# Patient Record
Sex: Female | Born: 1935 | Race: White | Hispanic: No | Smoking: Never smoker
Health system: Southern US, Community
[De-identification: ages and names within clinical notes are randomized; demographics above are authoritative.]

## PROBLEM LIST (undated history)

## (undated) DIAGNOSIS — K219 Gastro-esophageal reflux disease without esophagitis: Secondary | ICD-10-CM

## (undated) DIAGNOSIS — E559 Vitamin D deficiency, unspecified: Secondary | ICD-10-CM

## (undated) DIAGNOSIS — M810 Age-related osteoporosis without current pathological fracture: Secondary | ICD-10-CM

## (undated) DIAGNOSIS — F0390 Unspecified dementia without behavioral disturbance: Secondary | ICD-10-CM

---

## 2005-04-04 ENCOUNTER — Ambulatory Visit: Payer: Self-pay | Admitting: Internal Medicine

## 2005-09-24 ENCOUNTER — Ambulatory Visit: Payer: Self-pay | Admitting: Gastroenterology

## 2006-06-10 ENCOUNTER — Ambulatory Visit: Payer: Self-pay | Admitting: Internal Medicine

## 2007-04-01 ENCOUNTER — Ambulatory Visit: Payer: Self-pay | Admitting: Internal Medicine

## 2007-07-29 ENCOUNTER — Ambulatory Visit: Payer: Self-pay | Admitting: Internal Medicine

## 2008-10-12 ENCOUNTER — Ambulatory Visit: Payer: Self-pay | Admitting: Internal Medicine

## 2009-02-08 ENCOUNTER — Ambulatory Visit: Payer: Self-pay | Admitting: Orthopedic Surgery

## 2012-02-15 ENCOUNTER — Emergency Department: Payer: Self-pay | Admitting: Emergency Medicine

## 2012-06-11 ENCOUNTER — Ambulatory Visit: Payer: Self-pay | Admitting: Ophthalmology

## 2012-06-11 DIAGNOSIS — I499 Cardiac arrhythmia, unspecified: Secondary | ICD-10-CM

## 2012-06-17 ENCOUNTER — Ambulatory Visit: Payer: Self-pay | Admitting: Ophthalmology

## 2013-02-02 ENCOUNTER — Ambulatory Visit: Payer: Self-pay | Admitting: Ophthalmology

## 2013-03-09 ENCOUNTER — Ambulatory Visit: Payer: Self-pay | Admitting: Ophthalmology

## 2013-03-16 ENCOUNTER — Ambulatory Visit: Payer: Self-pay | Admitting: Ophthalmology

## 2014-12-20 NOTE — Op Note (Signed)
PATIENT NAME:  Megan Long, Megan Long MR#:  [id number] DATE OF BIRTH:  [birth year]  DATE OF PROCEDURE:  06/17/2012  PROCEDURE PERFORMED:  1. Pars plana vitrectomy of the right eye.  2. Focal laser of the right eye.   PREOPERATIVE DIAGNOSES:  1. Subretinal hemorrhage.  2. Macular aneurysm.   POSTOPERATIVE DIAGNOSES:  1. Subretinal hemorrhage.  2. Macular aneurysm.   ESTIMATED BLOOD LOSS: Less than 1 mL.   PRIMARY SURGEON: Aron BabaMatthew Tierany Appleby, M.D.   ANESTHESIA: Retrobulbar block of the right eye with monitored anesthesia care.   COMPLICATIONS: None.   INDICATIONS FOR PROCEDURE:  This is a patient who presented to my office with loss of vision approximately one month ago. Examination revealed significant subretinal macular hemorrhage with an associated macular aneurysm. The risks, benefits, and alternatives of the above procedure were discussed and the patient wished to proceed.   DETAILS OF PROCEDURE: After informed consent was obtained, the patient was brought to the operating suite at [facility]. The patient was placed in the supine position, was given a small dose of Alfenta, and a retrobulbar block was performed on the right eye by the primary surgeon without any complications. The right eye was prepped and draped in sterile manner. After lid speculum was inserted, a 25-gauge trocar was placed inferotemporally through displaced conjunctiva in an oblique fashion 4 mm beyond the limbus. The infusion cannula was turned on and inserted through the trocar and secured in position with Steri-Strips. Two more trocars were placed in a similar fashion superotemporally and superonasally. The vitreous cutter and light pipe were introduced into the eye and a core vitrectomy was performed. Peripheral vitreous was trimmed in a limited fashion. Focal laser was then introduced and laser was performed on the macular aneurysm to get it to close. The macular aneurysm opened and began with  significant arterial style bleeding. Pressure in the eye was elevated and allowed to sit for approximately one minute. Blood was removed using the vitreous cutter and to reveal the site of the bleed. An Endocutter was introduced and the macular aneurysm was cauterized. Bleeding quickly stopped and there was no extension of the subretinal blood. Given the recent bleed, the attempt of TPA was aborted. There was subretinal spacing already present and the decision was made to convert to a pneumatic displacement. Two to three rows of laser were placed around the area of cauterization in order to cauterize the retinal opening. An air-fluid exchange was then performed and 18% SS6 was used as an Systems developerair-gas exchange. The trocars were removed and the wounds were noted to be airtight. Pressure in the eye was confirmed to be approximately 15 mmHg. The lid speculum was removed and the eye was cleaned. 5 mg of dexamethasone were given into the inferior fornix and TobraDex was placed in the eye. A patch and shield were placed over the eye. The patient was taken to postanesthesia care with instructions to remain face down.   ____________________________ Ignacia FellingMatthew F. Champ MungoAppenzeller, MD mfa:bjt D: 06/17/2012 11:11:00 ET T: 06/17/2012 11:24:11 ET JOB#: [id number]  cc: Ignacia FellingMatthew F. Champ MungoAppenzeller, MD, <Dictator> Cline CoolsMATTHEW F Jaylene Arrowood MD ELECTRONICALLY SIGNED 06/30/2012 17:44

## 2014-12-23 NOTE — Op Note (Signed)
PATIENT NAME:  [name], Megan Long DATE OF BIRTH:  May 12, 1922  DATE OF PROCEDURE:  03/16/2013  PREOPERATIVE DIAGNOSIS: Visually significant cataract of the left eye.   POSTOPERATIVE DIAGNOSIS: Visually significant cataract of the left eye.   OPERATIVE PROCEDURE: Cataract extraction by phacoemulsification with implant of intraocular lens to left eye.   SURGEON: Galen ManilaWilliam Evana Runnels, MD.   ANESTHESIA:  1. Managed anesthesia care.  2. Topical tetracaine drops followed by 2% Xylocaine jelly applied in the preoperative holding area.   COMPLICATIONS: None.   TECHNIQUE: Stop and chop.   DESCRIPTION OF PROCEDURE: The patient was examined and consented in the preoperative holding area where the aforementioned topical anesthesia was applied to the left eye and then brought back to the Operating Room where the left eye was prepped and draped in the usual sterile ophthalmic fashion and a lid speculum was placed. A paracentesis was created with the side port blade and the anterior chamber was filled with viscoelastic. A near clear corneal incision was performed with the steel keratome. A continuous curvilinear capsulorrhexis was performed with a cystotome followed by the capsulorrhexis forceps. Hydrodissection and hydrodelineation were carried out with BSS on a blunt cannula. The lens was removed in a stop and chop technique and the remaining cortical material was removed with the irrigation-aspiration handpiece. The capsular bag was inflated with viscoelastic and the Tecnis ZCB00 22.5-diopter lens, serial #[id number], was placed in the capsular bag without complication. The remaining viscoelastic was removed from the eye with the irrigation-aspiration handpiece. The wounds were hydrated. The anterior chamber was flushed with Miostat and the eye was inflated to physiologic pressure. 0.1 mL of cefuroxime concentration 10 mg/mL was placed in the anterior chamber. The wounds were found to be water tight.  The eye was dressed with Vigamox. The patient was given protective glasses to wear throughout the day and a shield with which to sleep tonight. The patient was also given drops with which to begin a drop regimen today and will follow-up with me in one day.    ____________________________ Jerilee FieldWilliam L. Yilia Sacca, MD wlp:jm D: 03/16/2013 16:52:08 ET T: 03/16/2013 20:24:26 ET JOB#: [id number]  cc: Colvin Blatt L. Geneva Pallas, MD, <Dictator> Jerilee FieldWILLIAM L Chrstopher Malenfant MD ELECTRONICALLY SIGNED 03/19/2013 8:53

## 2017-04-17 DIAGNOSIS — Z23 Encounter for immunization: Secondary | ICD-10-CM | POA: Diagnosis not present

## 2017-04-17 DIAGNOSIS — Z66 Do not resuscitate: Secondary | ICD-10-CM | POA: Insufficient documentation

## 2017-04-17 DIAGNOSIS — Z0000 Encounter for general adult medical examination without abnormal findings: Secondary | ICD-10-CM | POA: Diagnosis not present

## 2018-03-30 DIAGNOSIS — R2689 Other abnormalities of gait and mobility: Secondary | ICD-10-CM | POA: Diagnosis not present

## 2018-03-30 DIAGNOSIS — R278 Other lack of coordination: Secondary | ICD-10-CM | POA: Diagnosis not present

## 2018-03-31 DIAGNOSIS — R278 Other lack of coordination: Secondary | ICD-10-CM | POA: Diagnosis not present

## 2018-03-31 DIAGNOSIS — R2689 Other abnormalities of gait and mobility: Secondary | ICD-10-CM | POA: Diagnosis not present

## 2018-04-01 DIAGNOSIS — R278 Other lack of coordination: Secondary | ICD-10-CM | POA: Diagnosis not present

## 2018-04-01 DIAGNOSIS — R2689 Other abnormalities of gait and mobility: Secondary | ICD-10-CM | POA: Diagnosis not present

## 2018-04-03 DIAGNOSIS — R2689 Other abnormalities of gait and mobility: Secondary | ICD-10-CM | POA: Diagnosis not present

## 2018-04-03 DIAGNOSIS — R278 Other lack of coordination: Secondary | ICD-10-CM | POA: Diagnosis not present

## 2018-04-03 DIAGNOSIS — R296 Repeated falls: Secondary | ICD-10-CM | POA: Diagnosis not present

## 2018-04-06 DIAGNOSIS — R296 Repeated falls: Secondary | ICD-10-CM | POA: Diagnosis not present

## 2018-04-06 DIAGNOSIS — R278 Other lack of coordination: Secondary | ICD-10-CM | POA: Diagnosis not present

## 2018-04-06 DIAGNOSIS — R2689 Other abnormalities of gait and mobility: Secondary | ICD-10-CM | POA: Diagnosis not present

## 2018-04-07 DIAGNOSIS — R2689 Other abnormalities of gait and mobility: Secondary | ICD-10-CM | POA: Diagnosis not present

## 2018-04-07 DIAGNOSIS — R278 Other lack of coordination: Secondary | ICD-10-CM | POA: Diagnosis not present

## 2018-04-07 DIAGNOSIS — R296 Repeated falls: Secondary | ICD-10-CM | POA: Diagnosis not present

## 2018-04-10 DIAGNOSIS — R278 Other lack of coordination: Secondary | ICD-10-CM | POA: Diagnosis not present

## 2018-04-10 DIAGNOSIS — R296 Repeated falls: Secondary | ICD-10-CM | POA: Diagnosis not present

## 2018-04-10 DIAGNOSIS — R2689 Other abnormalities of gait and mobility: Secondary | ICD-10-CM | POA: Diagnosis not present

## 2018-04-13 DIAGNOSIS — R2689 Other abnormalities of gait and mobility: Secondary | ICD-10-CM | POA: Diagnosis not present

## 2018-04-13 DIAGNOSIS — R296 Repeated falls: Secondary | ICD-10-CM | POA: Diagnosis not present

## 2018-04-13 DIAGNOSIS — R278 Other lack of coordination: Secondary | ICD-10-CM | POA: Diagnosis not present

## 2018-04-14 DIAGNOSIS — R2689 Other abnormalities of gait and mobility: Secondary | ICD-10-CM | POA: Diagnosis not present

## 2018-04-14 DIAGNOSIS — R278 Other lack of coordination: Secondary | ICD-10-CM | POA: Diagnosis not present

## 2018-04-14 DIAGNOSIS — R296 Repeated falls: Secondary | ICD-10-CM | POA: Diagnosis not present

## 2018-04-17 DIAGNOSIS — R278 Other lack of coordination: Secondary | ICD-10-CM | POA: Diagnosis not present

## 2018-04-17 DIAGNOSIS — R2689 Other abnormalities of gait and mobility: Secondary | ICD-10-CM | POA: Diagnosis not present

## 2018-04-17 DIAGNOSIS — R296 Repeated falls: Secondary | ICD-10-CM | POA: Diagnosis not present

## 2018-04-21 DIAGNOSIS — R2689 Other abnormalities of gait and mobility: Secondary | ICD-10-CM | POA: Diagnosis not present

## 2018-04-21 DIAGNOSIS — R296 Repeated falls: Secondary | ICD-10-CM | POA: Diagnosis not present

## 2018-04-21 DIAGNOSIS — R278 Other lack of coordination: Secondary | ICD-10-CM | POA: Diagnosis not present

## 2018-04-22 DIAGNOSIS — R296 Repeated falls: Secondary | ICD-10-CM | POA: Diagnosis not present

## 2018-04-22 DIAGNOSIS — R278 Other lack of coordination: Secondary | ICD-10-CM | POA: Diagnosis not present

## 2018-04-22 DIAGNOSIS — R2689 Other abnormalities of gait and mobility: Secondary | ICD-10-CM | POA: Diagnosis not present

## 2018-04-24 DIAGNOSIS — R278 Other lack of coordination: Secondary | ICD-10-CM | POA: Diagnosis not present

## 2018-04-24 DIAGNOSIS — R2689 Other abnormalities of gait and mobility: Secondary | ICD-10-CM | POA: Diagnosis not present

## 2018-04-24 DIAGNOSIS — R296 Repeated falls: Secondary | ICD-10-CM | POA: Diagnosis not present

## 2018-04-28 DIAGNOSIS — R278 Other lack of coordination: Secondary | ICD-10-CM | POA: Diagnosis not present

## 2018-04-28 DIAGNOSIS — R2689 Other abnormalities of gait and mobility: Secondary | ICD-10-CM | POA: Diagnosis not present

## 2018-04-28 DIAGNOSIS — R296 Repeated falls: Secondary | ICD-10-CM | POA: Diagnosis not present

## 2018-04-29 DIAGNOSIS — R2689 Other abnormalities of gait and mobility: Secondary | ICD-10-CM | POA: Diagnosis not present

## 2018-04-29 DIAGNOSIS — R296 Repeated falls: Secondary | ICD-10-CM | POA: Diagnosis not present

## 2018-04-29 DIAGNOSIS — R278 Other lack of coordination: Secondary | ICD-10-CM | POA: Diagnosis not present

## 2018-04-30 DIAGNOSIS — R296 Repeated falls: Secondary | ICD-10-CM | POA: Diagnosis not present

## 2018-04-30 DIAGNOSIS — R2689 Other abnormalities of gait and mobility: Secondary | ICD-10-CM | POA: Diagnosis not present

## 2018-04-30 DIAGNOSIS — R278 Other lack of coordination: Secondary | ICD-10-CM | POA: Diagnosis not present

## 2018-05-06 DIAGNOSIS — R278 Other lack of coordination: Secondary | ICD-10-CM | POA: Diagnosis not present

## 2018-05-06 DIAGNOSIS — R2689 Other abnormalities of gait and mobility: Secondary | ICD-10-CM | POA: Diagnosis not present

## 2018-05-07 DIAGNOSIS — R278 Other lack of coordination: Secondary | ICD-10-CM | POA: Diagnosis not present

## 2018-05-07 DIAGNOSIS — R2689 Other abnormalities of gait and mobility: Secondary | ICD-10-CM | POA: Diagnosis not present

## 2018-05-08 DIAGNOSIS — R2689 Other abnormalities of gait and mobility: Secondary | ICD-10-CM | POA: Diagnosis not present

## 2018-05-08 DIAGNOSIS — R278 Other lack of coordination: Secondary | ICD-10-CM | POA: Diagnosis not present

## 2018-05-11 DIAGNOSIS — R278 Other lack of coordination: Secondary | ICD-10-CM | POA: Diagnosis not present

## 2018-05-11 DIAGNOSIS — R2689 Other abnormalities of gait and mobility: Secondary | ICD-10-CM | POA: Diagnosis not present

## 2018-05-13 DIAGNOSIS — R2689 Other abnormalities of gait and mobility: Secondary | ICD-10-CM | POA: Diagnosis not present

## 2018-05-13 DIAGNOSIS — R278 Other lack of coordination: Secondary | ICD-10-CM | POA: Diagnosis not present

## 2018-05-18 DIAGNOSIS — R278 Other lack of coordination: Secondary | ICD-10-CM | POA: Diagnosis not present

## 2018-05-18 DIAGNOSIS — R2689 Other abnormalities of gait and mobility: Secondary | ICD-10-CM | POA: Diagnosis not present

## 2018-05-20 DIAGNOSIS — R278 Other lack of coordination: Secondary | ICD-10-CM | POA: Diagnosis not present

## 2018-05-20 DIAGNOSIS — R2689 Other abnormalities of gait and mobility: Secondary | ICD-10-CM | POA: Diagnosis not present

## 2018-05-26 DIAGNOSIS — R278 Other lack of coordination: Secondary | ICD-10-CM | POA: Diagnosis not present

## 2018-05-26 DIAGNOSIS — R2689 Other abnormalities of gait and mobility: Secondary | ICD-10-CM | POA: Diagnosis not present

## 2018-06-02 DIAGNOSIS — R278 Other lack of coordination: Secondary | ICD-10-CM | POA: Diagnosis not present

## 2018-06-02 DIAGNOSIS — R2689 Other abnormalities of gait and mobility: Secondary | ICD-10-CM | POA: Diagnosis not present

## 2018-12-03 DIAGNOSIS — R2681 Unsteadiness on feet: Secondary | ICD-10-CM | POA: Diagnosis not present

## 2018-12-03 DIAGNOSIS — M85849 Other specified disorders of bone density and structure, unspecified hand: Secondary | ICD-10-CM | POA: Diagnosis not present

## 2018-12-03 DIAGNOSIS — F0390 Unspecified dementia without behavioral disturbance: Secondary | ICD-10-CM | POA: Diagnosis not present

## 2018-12-08 DIAGNOSIS — Z79899 Other long term (current) drug therapy: Secondary | ICD-10-CM | POA: Diagnosis not present

## 2018-12-08 DIAGNOSIS — M461 Sacroiliitis, not elsewhere classified: Secondary | ICD-10-CM | POA: Diagnosis not present

## 2018-12-08 DIAGNOSIS — Z01419 Encounter for gynecological examination (general) (routine) without abnormal findings: Secondary | ICD-10-CM | POA: Diagnosis not present

## 2018-12-08 DIAGNOSIS — E559 Vitamin D deficiency, unspecified: Secondary | ICD-10-CM | POA: Diagnosis not present

## 2018-12-08 DIAGNOSIS — G894 Chronic pain syndrome: Secondary | ICD-10-CM | POA: Diagnosis not present

## 2018-12-08 DIAGNOSIS — Z1239 Encounter for other screening for malignant neoplasm of breast: Secondary | ICD-10-CM | POA: Diagnosis not present

## 2018-12-08 DIAGNOSIS — I4819 Other persistent atrial fibrillation: Secondary | ICD-10-CM | POA: Diagnosis not present

## 2018-12-08 DIAGNOSIS — I10 Essential (primary) hypertension: Secondary | ICD-10-CM | POA: Diagnosis not present

## 2018-12-08 DIAGNOSIS — M533 Sacrococcygeal disorders, not elsewhere classified: Secondary | ICD-10-CM | POA: Diagnosis not present

## 2018-12-08 DIAGNOSIS — R079 Chest pain, unspecified: Secondary | ICD-10-CM | POA: Diagnosis not present

## 2018-12-08 DIAGNOSIS — M79671 Pain in right foot: Secondary | ICD-10-CM | POA: Diagnosis not present

## 2018-12-08 DIAGNOSIS — N904 Leukoplakia of vulva: Secondary | ICD-10-CM | POA: Diagnosis not present

## 2018-12-08 DIAGNOSIS — M25561 Pain in right knee: Secondary | ICD-10-CM | POA: Diagnosis not present

## 2018-12-08 DIAGNOSIS — E1169 Type 2 diabetes mellitus with other specified complication: Secondary | ICD-10-CM | POA: Diagnosis not present

## 2018-12-08 DIAGNOSIS — N400 Enlarged prostate without lower urinary tract symptoms: Secondary | ICD-10-CM | POA: Diagnosis not present

## 2018-12-08 DIAGNOSIS — E782 Mixed hyperlipidemia: Secondary | ICD-10-CM | POA: Diagnosis not present

## 2018-12-08 DIAGNOSIS — M25674 Stiffness of right foot, not elsewhere classified: Secondary | ICD-10-CM | POA: Diagnosis not present

## 2018-12-17 DIAGNOSIS — N390 Urinary tract infection, site not specified: Secondary | ICD-10-CM | POA: Diagnosis not present

## 2018-12-17 DIAGNOSIS — R399 Unspecified symptoms and signs involving the genitourinary system: Secondary | ICD-10-CM | POA: Diagnosis not present

## 2018-12-17 DIAGNOSIS — E559 Vitamin D deficiency, unspecified: Secondary | ICD-10-CM | POA: Diagnosis not present

## 2018-12-17 DIAGNOSIS — R609 Edema, unspecified: Secondary | ICD-10-CM | POA: Diagnosis not present

## 2018-12-17 DIAGNOSIS — R7309 Other abnormal glucose: Secondary | ICD-10-CM | POA: Diagnosis not present

## 2018-12-17 DIAGNOSIS — F0390 Unspecified dementia without behavioral disturbance: Secondary | ICD-10-CM | POA: Diagnosis not present

## 2018-12-17 DIAGNOSIS — Z79899 Other long term (current) drug therapy: Secondary | ICD-10-CM | POA: Diagnosis not present

## 2018-12-23 DIAGNOSIS — R399 Unspecified symptoms and signs involving the genitourinary system: Secondary | ICD-10-CM | POA: Diagnosis not present

## 2019-01-21 ENCOUNTER — Emergency Department: Payer: PPO

## 2019-01-21 ENCOUNTER — Encounter: Payer: Self-pay | Admitting: Emergency Medicine

## 2019-01-21 ENCOUNTER — Other Ambulatory Visit: Payer: Self-pay

## 2019-01-21 ENCOUNTER — Inpatient Hospital Stay
Admission: EM | Admit: 2019-01-21 | Discharge: 2019-01-22 | DRG: 379 | Disposition: A | Discharge status: Home / Self Care | Payer: PPO | Attending: Internal Medicine | Admitting: Internal Medicine

## 2019-01-21 DIAGNOSIS — Z79899 Other long term (current) drug therapy: Secondary | ICD-10-CM

## 2019-01-21 DIAGNOSIS — R569 Unspecified convulsions: Secondary | ICD-10-CM | POA: Diagnosis present

## 2019-01-21 DIAGNOSIS — G479 Sleep disorder, unspecified: Secondary | ICD-10-CM | POA: Diagnosis not present

## 2019-01-21 DIAGNOSIS — Z1159 Encounter for screening for other viral diseases: Secondary | ICD-10-CM | POA: Diagnosis not present

## 2019-01-21 DIAGNOSIS — K922 Gastrointestinal hemorrhage, unspecified: Secondary | ICD-10-CM | POA: Diagnosis present

## 2019-01-21 DIAGNOSIS — K920 Hematemesis: Secondary | ICD-10-CM | POA: Diagnosis not present

## 2019-01-21 DIAGNOSIS — F0390 Unspecified dementia without behavioral disturbance: Secondary | ICD-10-CM | POA: Diagnosis present

## 2019-01-21 DIAGNOSIS — R112 Nausea with vomiting, unspecified: Secondary | ICD-10-CM | POA: Diagnosis not present

## 2019-01-21 DIAGNOSIS — K2100 Gastro-esophageal reflux disease with esophagitis, without bleeding: Secondary | ICD-10-CM

## 2019-01-21 DIAGNOSIS — R030 Elevated blood-pressure reading, without diagnosis of hypertension: Secondary | ICD-10-CM | POA: Diagnosis not present

## 2019-01-21 DIAGNOSIS — Z66 Do not resuscitate: Secondary | ICD-10-CM | POA: Diagnosis not present

## 2019-01-21 DIAGNOSIS — Z03818 Encounter for observation for suspected exposure to other biological agents ruled out: Secondary | ICD-10-CM | POA: Diagnosis not present

## 2019-01-21 DIAGNOSIS — K209 Esophagitis, unspecified: Secondary | ICD-10-CM | POA: Diagnosis not present

## 2019-01-21 DIAGNOSIS — E876 Hypokalemia: Secondary | ICD-10-CM | POA: Diagnosis not present

## 2019-01-21 DIAGNOSIS — K219 Gastro-esophageal reflux disease without esophagitis: Secondary | ICD-10-CM | POA: Diagnosis not present

## 2019-01-21 DIAGNOSIS — K449 Diaphragmatic hernia without obstruction or gangrene: Secondary | ICD-10-CM | POA: Diagnosis present

## 2019-01-21 DIAGNOSIS — N3000 Acute cystitis without hematuria: Secondary | ICD-10-CM | POA: Diagnosis not present

## 2019-01-21 DIAGNOSIS — R195 Other fecal abnormalities: Secondary | ICD-10-CM | POA: Diagnosis not present

## 2019-01-21 DIAGNOSIS — K921 Melena: Secondary | ICD-10-CM | POA: Diagnosis not present

## 2019-01-21 DIAGNOSIS — R1111 Vomiting without nausea: Secondary | ICD-10-CM | POA: Diagnosis not present

## 2019-01-21 DIAGNOSIS — E559 Vitamin D deficiency, unspecified: Secondary | ICD-10-CM | POA: Diagnosis not present

## 2019-01-21 HISTORY — DX: Unspecified dementia, unspecified severity, without behavioral disturbance, psychotic disturbance, mood disturbance, and anxiety: F03.90

## 2019-01-21 LAB — COMPREHENSIVE METABOLIC PANEL
ALT: 12 U/L (ref 0–44)
AST: 24 U/L (ref 15–41)
Albumin: 3.8 g/dL (ref 3.5–5.0)
Alkaline Phosphatase: 89 U/L (ref 38–126)
Anion gap: 11 (ref 5–15)
BUN: 53 mg/dL — ABNORMAL HIGH (ref 8–23)
CO2: 29 mmol/L (ref 22–32)
Calcium: 9.1 mg/dL (ref 8.9–10.3)
Chloride: 100 mmol/L (ref 98–111)
Creatinine, Ser: 1.24 mg/dL — ABNORMAL HIGH (ref 0.44–1.00)
GFR calc Af Amer: 43 mL/min — ABNORMAL LOW (ref >60)
GFR calc non Af Amer: 37 mL/min — ABNORMAL LOW (ref >60)
Glucose, Bld: 110 mg/dL — ABNORMAL HIGH (ref 70–99)
Potassium: 3.4 mmol/L — ABNORMAL LOW (ref 3.5–5.1)
Sodium: 140 mmol/L (ref 135–145)
Total Bilirubin: 1.6 mg/dL — ABNORMAL HIGH (ref 0.3–1.2)
Total Protein: 6.9 g/dL (ref 6.5–8.1)

## 2019-01-21 LAB — HEMOGLOBIN AND HEMATOCRIT, BLOOD
HCT: 42.1 % (ref 36.0–46.0)
Hemoglobin: 13.7 g/dL (ref 12.0–15.0)

## 2019-01-21 LAB — CBC
HCT: 45.9 % (ref 36.0–46.0)
Hemoglobin: 14.8 g/dL (ref 12.0–15.0)
MCH: 28.7 pg (ref 26.0–34.0)
MCHC: 32.2 g/dL (ref 30.0–36.0)
MCV: 89.1 fL (ref 80.0–100.0)
Platelets: 198 10*3/uL (ref 150–400)
RBC: 5.15 MIL/uL — ABNORMAL HIGH (ref 3.87–5.11)
RDW: 14.9 % (ref 11.5–15.5)
WBC: 11.8 10*3/uL — ABNORMAL HIGH (ref 4.0–10.5)
nRBC: 0 % (ref 0.0–0.2)

## 2019-01-21 LAB — PROTIME-INR
INR: 1.1 (ref 0.8–1.2)
Prothrombin Time: 13.6 seconds (ref 11.4–15.2)

## 2019-01-21 LAB — SARS CORONAVIRUS 2 BY RT PCR (HOSPITAL ORDER, PERFORMED IN ~~LOC~~ HOSPITAL LAB): SARS Coronavirus 2: NEGATIVE

## 2019-01-21 LAB — TYPE AND SCREEN
ABO/RH(D): O NEG
Antibody Screen: NEGATIVE

## 2019-01-21 LAB — APTT: aPTT: 25 seconds (ref 24–36)

## 2019-01-21 MED ORDER — SODIUM CHLORIDE 0.9 % IV BOLUS
250.0000 mL | Freq: Once | INTRAVENOUS | Status: AC
  Administered 2019-01-21: 250 mL via INTRAVENOUS

## 2019-01-21 MED ORDER — PANTOPRAZOLE SODIUM 40 MG IV SOLR
40.0000 mg | Freq: Two times a day (BID) | INTRAVENOUS | Status: DC
  Administered 2019-01-22 (×2): 40 mg via INTRAVENOUS
  Filled 2019-01-21 (×2): qty 40

## 2019-01-21 MED ORDER — SODIUM CHLORIDE 0.9 % IV SOLN
80.0000 mg | Freq: Once | INTRAVENOUS | Status: AC
  Administered 2019-01-21: 80 mg via INTRAVENOUS
  Filled 2019-01-21: qty 80

## 2019-01-21 MED ORDER — POTASSIUM CHLORIDE 10 MEQ/100ML IV SOLN
10.0000 meq | INTRAVENOUS | Status: AC
  Administered 2019-01-21: 10 meq via INTRAVENOUS
  Filled 2019-01-21: qty 100

## 2019-01-21 MED ORDER — PANTOPRAZOLE SODIUM 40 MG IV SOLR
40.0000 mg | Freq: Once | INTRAVENOUS | Status: AC
  Administered 2019-01-21: 40 mg via INTRAVENOUS
  Filled 2019-01-21: qty 40

## 2019-01-21 MED ORDER — VITAMIN D 25 MCG (1000 UNIT) PO TABS
1000.0000 [IU] | ORAL_TABLET | ORAL | Status: DC
  Administered 2019-01-22: 1000 [IU] via ORAL
  Filled 2019-01-21: qty 1

## 2019-01-21 NOTE — H&P (Signed)
[facility] [place] [facility]   PATIENT NAME: Megan Long    MR#:  [id number]  DATE OF BIRTH:  1922-04-07  DATE OF ADMISSION:  01/21/2019  PRIMARY CARE PHYSICIAN: Marisue IvanLinthavong, Kanhka, MD   REQUESTING/REFERRING PHYSICIAN: Willy Eddyobinson, Patrick  CHIEF COMPLAINT:   Chief Complaint  Patient presents with  . GI Problem  . Hematemesis    HISTORY OF PRESENT ILLNESS:  Megan Long  is a 85 y.o. female with a known history of gastroesophageal reflux disease and dementia who was brought in from nursing home with complaints of coffee-ground emesis and having dark stools that started last night.  Patient was evaluated by doctors making house call and reported to have evidence of coffee-ground emesis.  Patient not on any blood thinners.  No prior history of GI bleed.  No abdominal pain.  Daughter at bedside also confirmed patient had some dark stools.  No further vomiting or dark stools today.  Patient was evaluated in the emergency room and found to be hemodynamically stable.  Hemoglobin stable at 14.8.  Abdominal /chest x-ray done with no evidence of bowel obstruction or ileus.  Large hiatal hernia noted.  No acute cardiopulmonary disease.  Medical service called to admit patient for further evaluation and management.  PAST MEDICAL HISTORY:   Past Medical History:  Diagnosis Date  . Dementia (HCC)     PAST SURGICAL HISTORY:  History reviewed. No pertinent surgical history.  SOCIAL HISTORY:   Social History   Tobacco Use  . Smoking status: Not on file  Substance Use Topics  . Alcohol use: Not on file   Social history; No history of smoking alcohol or IV drug use.  FAMILY HISTORY:  Patient is a resident of nursing facility.  No family history of coronary artery disease or diabetes mellitus.  DRUG ALLERGIES:  No Known Allergies  REVIEW OF SYSTEMS:   ROS unobtainable due to underlying dementia  MEDICATIONS AT HOME:   Prior to Admission medications    Medication Sig Start Date End Date Taking? Authorizing Provider  cholecalciferol (VITAMIN D3) 25 MCG (1000 UT) tablet Take 1,000 Units by mouth 3 (three) times a week.   Yes [provider]      VITAL SIGNS:  Blood pressure 137/61, pulse 77, temperature (!) 97.5 F (36.4 C), temperature source Oral, resp. rate 17, height 4\' 11"  (1.499 m), weight 54.4 kg, SpO2 94 %.  PHYSICAL EXAMINATION:  Physical Exam  GENERAL:  85 y.o.-year-old patient lying in the bed with no acute distress.  EYES: Pupils equal, round, reactive to light and accommodation. No scleral icterus. Extraocular muscles intact.  HEENT: Head atraumatic, normocephalic. Oropharynx and nasopharynx clear.  NECK:  Supple, no jugular venous distention. No thyroid enlargement, no tenderness.  LUNGS: Normal breath sounds bilaterally, no wheezing, rales,rhonchi or crepitation. No use of accessory muscles of respiration.  CARDIOVASCULAR: S1, S2 normal. No murmurs, rubs, or gallops.  ABDOMEN: Soft, nontender, nondistended. Bowel sounds present. No organomegaly or mass.  EXTREMITIES: No pedal edema, cyanosis, or clubbing.  NEUROLOGIC: Cranial nerves II through XII are intact.  Generalized weakness. . Sensation intact. Gait not checked.  PSYCHIATRIC: The patient is alert and oriented x 3.  SKIN: No obvious rash, lesion, or ulcer.   LABORATORY PANEL:   CBC Recent Labs  Lab 01/21/19 1450  WBC 11.8*  HGB 14.8  HCT 45.9  PLT 198   ------------------------------------------------------------------------------------------------------------------  Chemistries  Recent Labs  Lab 01/21/19 1450  NA 140  K 3.4*  CL 100  CO2 29  GLUCOSE 110*  BUN 53*  CREATININE 1.24*  CALCIUM 9.1  AST 24  ALT 12  ALKPHOS 89  BILITOT 1.6*   ------------------------------------------------------------------------------------------------------------------  Cardiac Enzymes No results for input(s): TROPONINI in the last 168 hours.  ------------------------------------------------------------------------------------------------------------------  RADIOLOGY:  Dg Abdomen Acute W/chest  Result Date: 01/21/2019 CLINICAL DATA:  Nausea, vomiting. EXAM: DG ABDOMEN ACUTE W/ 1V CHEST COMPARISON:  Radiographs of February 15, 2012. FINDINGS: There is no evidence of dilated bowel loops or free intraperitoneal air. No radiopaque calculi or other significant radiographic abnormality is seen. Stable cardiomegaly. Large hiatal hernia is noted. Atherosclerosis of thoracic aorta is noted. Both lungs are clear. IMPRESSION: No evidence of bowel obstruction or ileus. Large hiatal hernia is noted. No acute cardiopulmonary disease. Aortic Atherosclerosis (ICD10-I70.0). Electronically Signed   By: Lupita Raider M.D.   On: 01/21/2019 15:51      IMPRESSION AND PLAN:  Patient is a 85 year old female with history of gastroesophageal reflux disease and dementia being admitted for evaluation of GI bleed.  1.  GI bleed. Patient presented with coffee-ground emesis and having dark stools last night.  No further coffee-ground emesis today. Hemoglobin stable at 14.8. Placed on IV Protonix 40 mg every 12 hourly.  Monitor serial hemoglobin every 6 hours. Transfuse if acute major bleeding or significant drop in hemoglobin. I placed gastroenterology consult and Dr. Maximino Greenland will see patient in a.m.  She recommended clear liquid diet for now.  N.p.o. after midnight.  2.  Gastroesophageal for disease Continue PPI  3.  Hypokalemia Replaced.  Follow-up on repeat levels in a.m.  DVT prophylaxis; SCDs No heparin product due to GI bleed  All the records are reviewed and case discussed with ED provider. Management plans discussed with the patient, family and they are in agreement.  Patient's daughter who is also healthcare power of attorney was present at bedside in the emergency room.  Mrs. Ernst Spell.  She confirmed CODE STATUS to be DNR/DNI.  She was  updated on treatment plans and all questions were answered.  CODE STATUS: DNR DNI  TOTAL TIME TAKING CARE OF THIS PATIENT: 55 minutes.    Adrine Hayworth M.D on 01/21/2019 at 6:09 PM  Between 7am to 6pm - Pager - [phone]  After 6pm go to [email or web address] - Social research officer, government  [facility]  Office  [phone]  CC: Primary care physician; Marisue Ivan, MD   Note: This dictation was prepared with Dragon dictation along with smaller phrase technology. Any transcriptional errors that result from this process are unintentional.

## 2019-01-21 NOTE — ED Provider Notes (Signed)
[facility] Emergency Department Provider Note    First MD Initiated Contact with Patient 01/21/19 1504     (approximate)  I have reviewed the triage vital signs and the nursing notes.   HISTORY  Chief Complaint GI Problem and Hematemesis    HPI Megan Long is a 85 y.o. female presents the ER for evaluation of coffee ground emesis.  Symptoms reportedly started last night.  Patient at [facility]  and had doctors making house calls come seizure this morning reportedly most of the clothing and bed sheets were covered in coffee-ground emesis.  She not on any blood thinners.  She is accompanied by her daughter due to her dementia.  No report of any fevers.  No history of GI bleed but does have a history of reflux and heartburn.  She denies any pain at this time.  Has also noted some darker colored stool.   Past Medical History:  Diagnosis Date  . Dementia (HCC)    No family history on file. History reviewed. No pertinent surgical history. Patient Active Problem List   Diagnosis Date Noted  . GI bleed 01/21/2019      Prior to Admission medications   Medication Sig Start Date End Date Taking? Authorizing Provider  cholecalciferol (VITAMIN D3) 25 MCG (1000 UT) tablet Take 1,000 Units by mouth 3 (three) times a week.   Yes [provider]    Allergies Patient has no known allergies.    Social History Social History   Tobacco Use  . Smoking status: Not on file  Substance Use Topics  . Alcohol use: Not on file  . Drug use: Not on file    Review of Systems Patient denies headaches, rhinorrhea, blurry vision, numbness, shortness of breath, chest pain, edema, cough, abdominal pain, nausea, vomiting, diarrhea, dysuria, fevers, rashes or hallucinations unless otherwise stated above in HPI. ____________________________________________   PHYSICAL EXAM:  VITAL SIGNS: Vitals:   01/21/19 1900 01/21/19 2011  BP: (!) 156/72 (!) 143/56   Pulse: 69 65  Resp: (!) 22 18  Temp:  98.4 F (36.9 C)  SpO2: 97% 95%    Constitutional: Alert and oriented.  Eyes: Conjunctivae are normal.  Head: Atraumatic. Nose: No congestion/rhinnorhea. Mouth/Throat: Mucous membranes are moist.   Neck: No stridor. Painless ROM.  Cardiovascular: Normal rate, regular rhythm. Grossly normal heart sounds.  Good peripheral circulation. Respiratory: Normal respiratory effort.  No retractions. Lungs CTAB. Gastrointestinal: Soft and nontender. No distention. No abdominal bruits. No CVA tenderness. Genitourinary: deferred Musculoskeletal: No lower extremity tenderness nor edema.  No joint effusions. Neurologic:  Normal speech and language. No gross focal neurologic deficits are appreciated. No facial droop Skin:  Skin is warm, dry and intact. No rash noted. Psychiatric: Mood and affect are normal. Speech and behavior are normal.  ____________________________________________   LABS (all labs ordered are listed, but only abnormal results are displayed)  Results for orders placed or performed during the hospital encounter of 01/21/19 (from the past 24 hour(s))  Comprehensive metabolic panel     Status: Abnormal   Collection Time: 01/21/19  2:50 PM  Result Value Ref Range   Sodium 140 135 - 145 mmol/L   Potassium 3.4 (L) 3.5 - 5.1 mmol/L   Chloride 100 98 - 111 mmol/L   CO2 29 22 - 32 mmol/L   Glucose, Bld 110 (H) 70 - 99 mg/dL   BUN 53 (H) 8 - 23 mg/dL   Creatinine, Ser 2.83 (H) 0.44 - 1.00  mg/dL   Calcium 9.1 8.9 - 78.2 mg/dL   Total Protein 6.9 6.5 - 8.1 g/dL   Albumin 3.8 3.5 - 5.0 g/dL   AST 24 15 - 41 U/L   ALT 12 0 - 44 U/L   Alkaline Phosphatase 89 38 - 126 U/L   Total Bilirubin 1.6 (H) 0.3 - 1.2 mg/dL   GFR calc non Af Amer 37 (L) >60 mL/min   GFR calc Af Amer 43 (L) >60 mL/min   Anion gap 11 5 - 15  CBC     Status: Abnormal   Collection Time: 01/21/19  2:50 PM  Result Value Ref Range   WBC 11.8 (H) 4.0 - 10.5 K/uL   RBC 5.15  (H) 3.87 - 5.11 MIL/uL   Hemoglobin 14.8 12.0 - 15.0 g/dL   HCT 95.6 21.3 - 08.6 %   MCV 89.1 80.0 - 100.0 fL   MCH 28.7 26.0 - 34.0 pg   MCHC 32.2 30.0 - 36.0 g/dL   RDW 57.8 46.9 - 62.9 %   Platelets 198 150 - 400 K/uL   nRBC 0.0 0.0 - 0.2 %  Type and screen [facility]     Status: None   Collection Time: 01/21/19  2:50 PM  Result Value Ref Range   ABO/RH(D) O NEG    Antibody Screen NEG    Sample Expiration      01/24/2019,2359 Performed at [facility], [address] [place], [place] [address]   APTT     Status: None   Collection Time: 01/21/19  2:50 PM  Result Value Ref Range   aPTT 25 24 - 36 seconds  Protime-INR     Status: None   Collection Time: 01/21/19  2:50 PM  Result Value Ref Range   Prothrombin Time 13.6 11.4 - 15.2 seconds   INR 1.1 0.8 - 1.2  SARS Coronavirus 2 (CEPHEID - Performed in [facility] hospital lab), Hosp Order     Status: None   Collection Time: 01/21/19  3:19 PM  Result Value Ref Range   SARS Coronavirus 2 NEGATIVE NEGATIVE  Hemoglobin and hematocrit, blood     Status: None   Collection Time: 01/21/19  8:02 PM  Result Value Ref Range   Hemoglobin 13.7 12.0 - 15.0 g/dL   HCT 32.4 40.1 - 02.7 %   ____________________________________________  EKG My review and personal interpretation at Time: 15:13   Indication: hematemesis  Rate: 80  Rhythm: sinus Axis: normal Other: normal intervals, no stemi ____________________________________________  RADIOLOGY  I personally reviewed all radiographic images ordered to evaluate for the above acute complaints and reviewed radiology reports and findings.  These findings were personally discussed with the patient.  Please see medical record for radiology report.  ____________________________________________   PROCEDURES  Procedure(s) performed:  Procedures    Critical Care performed: no ____________________________________________   INITIAL IMPRESSION /  ASSESSMENT AND PLAN / ED COURSE  Pertinent labs & imaging results that were available during my care of the patient were reviewed by me and considered in my medical decision making (see chart for details).   DDX: ugib, pud, gastritis, obstruction, enteritis, dehydration  Megan Long is a 85 y.o. who presents to the ED with symptoms as described above concerning for upper GI bleed.  She is currently protecting her airway.  Abdominal exam is soft and benign.  Will check blood work for the blood differential.  Anticipate patient will require hospitalization.  Clinical Course as of Jan 22 16  Thu Jan 21, 2019  1556 Blood work does show that she has AKI with elevated BUN concerning for GI bleed but her hemoglobin is currently stable.  Currently awaiting COVID test that she is low risk.  Will start on Protonix drip she does not have any history of cirrhosis or liver disease.   [PR]    Clinical Course User Index [PR] Willy Eddyobinson, Londyn Hotard, MD    The patient was evaluated in Emergency Department today for the symptoms described in the history of present illness. He/she was evaluated in the context of the global COVID-19 pandemic, which necessitated consideration that the patient might be at risk for infection with the SARS-CoV-2 virus that causes COVID-19. Institutional protocols and algorithms that pertain to the evaluation of patients at risk for COVID-19 are in a state of rapid change based on information released by regulatory bodies including the CDC and federal and state organizations. These policies and algorithms were followed during the patient's care in the ED.  As part of my medical decision making, I reviewed the following data within the electronic MEDICAL RECORD NUMBER Nursing notes reviewed and incorporated, Labs reviewed, notes from prior ED visits and  Controlled Substance Database   ____________________________________________   FINAL CLINICAL IMPRESSION(S) / ED DIAGNOSES  Final  diagnoses:  Coffee ground emesis      NEW MEDICATIONS STARTED DURING THIS VISIT:  Current Discharge Medication List       Note:  This document was prepared using Dragon voice recognition software and may include unintentional dictation errors.    Willy Eddyobinson, Gari Trovato, MD 01/22/19 [phone]

## 2019-01-21 NOTE — ED Triage Notes (Signed)
Pt sent by MD for upper GI bleed and coffee ground emesis. Pt with sx;s since last pm.

## 2019-01-21 NOTE — ED Notes (Signed)
T&S,green,purple,blue,2sst was sent to lab.

## 2019-01-21 NOTE — ED Notes (Signed)
ED TO INPATIENT HANDOFF REPORT  ED Nurse Name and Phone #: Clinton Sawyer [phone]  S Name/Age/Gender Megan Long 85 y.o. female Room/Bed: ED14A/ED14A  Code Status   Code Status: DNR  Home/SNF/Other Nursing Home Patient oriented to: self and place Is this baseline? Yes   Triage Complete: Triage complete  Chief Complaint weakness  Triage Note Pt sent by MD for upper GI bleed and coffee ground emesis. Pt with sx;s since last pm.   Allergies No Known Allergies  Level of Care/Admitting Diagnosis ED Disposition    ED Disposition Condition Comment   Admit  Hospital Area: [facility] [[id number]]  Level of Care: Med-Surg [16]  Covid Evaluation: Screening Protocol (No Symptoms)  Diagnosis: GI bleed [[id number]]  Admitting Physician: Jama Flavors [[id number]]  Attending Physician: Jama Flavors [[id number]]  Estimated length of stay: past midnight tomorrow  Certification:: I certify this patient will need inpatient services for at least 2 midnights  PT Class (Do Not Modify): Inpatient [101]  PT Acc Code (Do Not Modify): Private [1]       B Medical/Surgery History Past Medical History:  Diagnosis Date  . Dementia (HCC)    History reviewed. No pertinent surgical history.   A IV Location/Drains/Wounds Patient Lines/Drains/Airways Status   Active Line/Drains/Airways    Name:   Placement date:   Placement time:   Site:   Days:   Peripheral IV 01/21/19 Left Antecubital   01/21/19    1445    Antecubital   less than 1          Intake/Output Last 24 hours No intake or output data in the 24 hours ending 01/21/19 1914  Labs/Imaging Results for orders placed or performed during the hospital encounter of 01/21/19 (from the past 48 hour(s))  Comprehensive metabolic panel     Status: Abnormal   Collection Time: 01/21/19  2:50 PM  Result Value Ref Range   Sodium 140 135 - 145 mmol/L   Potassium 3.4 (L) 3.5 - 5.1 mmol/L   Chloride 100 98 - 111 mmol/L   CO2 29 22 - 32 mmol/L   Glucose, Bld 110 (H) 70 - 99 mg/dL   BUN 53 (H) 8 - 23 mg/dL   Creatinine, Ser 6.57 (H) 0.44 - 1.00 mg/dL   Calcium 9.1 8.9 - 84.6 mg/dL   Total Protein 6.9 6.5 - 8.1 g/dL   Albumin 3.8 3.5 - 5.0 g/dL   AST 24 15 - 41 U/L   ALT 12 0 - 44 U/L   Alkaline Phosphatase 89 38 - 126 U/L   Total Bilirubin 1.6 (H) 0.3 - 1.2 mg/dL   GFR calc non Af Amer 37 (L) >60 mL/min   GFR calc Af Amer 43 (L) >60 mL/min   Anion gap 11 5 - 15    Comment: Performed at [facility], [address] [place], [place] [address]  CBC     Status: Abnormal   Collection Time: 01/21/19  2:50 PM  Result Value Ref Range   WBC 11.8 (H) 4.0 - 10.5 K/uL   RBC 5.15 (H) 3.87 - 5.11 MIL/uL   Hemoglobin 14.8 12.0 - 15.0 g/dL   HCT 28.4 13.2 - 44.0 %   MCV 89.1 80.0 - 100.0 fL   MCH 28.7 26.0 - 34.0 pg   MCHC 32.2 30.0 - 36.0 g/dL   RDW 10.2 72.5 - 36.6 %   Platelets 198 150 - 400 K/uL   nRBC 0.0 0.0 - 0.2 %  Comment: Performed at [facility], [address] [place], [place] [address]  Type and screen [facility]     Status: None   Collection Time: 01/21/19  2:50 PM  Result Value Ref Range   ABO/RH(D) O NEG    Antibody Screen NEG    Sample Expiration      01/24/2019,2359 Performed at [facility], [address] [place], [place] [address]   APTT     Status: None   Collection Time: 01/21/19  2:50 PM  Result Value Ref Range   aPTT 25 24 - 36 seconds    Comment: Performed at [facility], [address] [place], [place] [address]  Protime-INR     Status: None   Collection Time: 01/21/19  2:50 PM  Result Value Ref Range   Prothrombin Time 13.6 11.4 - 15.2 seconds   INR 1.1 0.8 - 1.2    Comment: (NOTE) INR goal varies based on device and disease states. Performed at [facility], [address] [place], [place] [address]   SARS Coronavirus 2 (CEPHEID - Performed in [facility] hospital lab), Hosp Order     Status: None   Collection  Time: 01/21/19  3:19 PM  Result Value Ref Range   SARS Coronavirus 2 NEGATIVE NEGATIVE    Comment: (NOTE) If result is NEGATIVE SARS-CoV-2 target nucleic acids are NOT DETECTED. The SARS-CoV-2 RNA is generally detectable in upper and lower  respiratory specimens during the acute phase of infection. The lowest  concentration of SARS-CoV-2 viral copies this assay can detect is 250  copies / mL. A negative result does not preclude SARS-CoV-2 infection  and should not be used as the sole basis for treatment or other  patient management decisions.  A negative result may occur with  improper specimen collection / handling, submission of specimen other  than nasopharyngeal swab, presence of viral mutation(s) within the  areas targeted by this assay, and inadequate number of viral copies  (<250 copies / mL). A negative result must be combined with clinical  observations, patient history, and epidemiological information. If result is POSITIVE SARS-CoV-2 target nucleic acids are DETECTED. The SARS-CoV-2 RNA is generally detectable in upper and lower  respiratory specimens dur ing the acute phase of infection.  Positive  results are indicative of active infection with SARS-CoV-2.  Clinical  correlation with patient history and other diagnostic information is  necessary to determine patient infection status.  Positive results do  not rule out bacterial infection or co-infection with other viruses. If result is PRESUMPTIVE POSTIVE SARS-CoV-2 nucleic acids MAY BE PRESENT.   A presumptive positive result was obtained on the submitted specimen  and confirmed on repeat testing.  While 2019 novel coronavirus  (SARS-CoV-2) nucleic acids may be present in the submitted sample  additional confirmatory testing may be necessary for epidemiological  and / or clinical management purposes  to differentiate between  SARS-CoV-2 and other Sarbecovirus currently known to infect humans.  If clinically indicated  additional testing with an alternate test  methodology [id number]) is advised. The SARS-CoV-2 RNA is generally  detectable in upper and lower respiratory sp ecimens during the acute  phase of infection. The expected result is Negative. Fact Sheet for Patients:  BoilerBrush.com.cyhttps://www.fda.gov/media/136312/download Fact Sheet for Healthcare Providers: https://pope.com/https://www.fda.gov/media/136313/download This test is not yet approved or cleared by the [place] FDA and has been authorized for detection and/or diagnosis of SARS-CoV-2 by FDA under an Emergency Use Authorization (EUA).  This  EUA will remain in effect (meaning this test can be used) for the duration of the COVID-19 declaration under Section 564(b)(1) of the Act, 21 U.S.C. section 360bbb-3(b)(1), unless the authorization is terminated or revoked sooner. Performed at [facility], [address], [place], [place] [address]    Dg Abdomen Acute W/chest  Result Date: 01/21/2019 CLINICAL DATA:  Nausea, vomiting. EXAM: DG ABDOMEN ACUTE W/ 1V CHEST COMPARISON:  Radiographs of February 15, 2012. FINDINGS: There is no evidence of dilated bowel loops or free intraperitoneal air. No radiopaque calculi or other significant radiographic abnormality is seen. Stable cardiomegaly. Large hiatal hernia is noted. Atherosclerosis of thoracic aorta is noted. Both lungs are clear. IMPRESSION: No evidence of bowel obstruction or ileus. Large hiatal hernia is noted. No acute cardiopulmonary disease. Aortic Atherosclerosis (ICD10-I70.0). Electronically Signed   By: Lupita Raider M.D.   On: 01/21/2019 15:51    Pending Labs Unresulted Labs (From admission, onward)    Start     Ordered   01/22/19 0500  Basic metabolic panel  Tomorrow morning,   STAT     01/21/19 1805   01/22/19 0500  CBC  Tomorrow morning,   STAT     01/21/19 1805   01/22/19 0500  Magnesium  Tomorrow morning,   STAT     01/21/19 1805   01/22/19 0200  Hemoglobin and hematocrit, blood  Once,   STAT      01/21/19 1805   01/21/19 2000  Hemoglobin and hematocrit, blood  Once,   STAT     01/21/19 1805          Vitals/Pain Today's Vitals   01/21/19 1600 01/21/19 1630 01/21/19 1730 01/21/19 1800  BP: (!) 130/56 137/61 (!) 141/57 129/72  Pulse: 76 77 (!) 57 66  Resp: 16 17 15 19   Temp:      TempSrc:      SpO2: 93% 94% 94% 93%  Weight:      Height:      PainSc:        Isolation Precautions No active isolations  Medications Medications  cholecalciferol (VITAMIN D3) tablet 1,000 Units (has no administration in time range)  pantoprazole (PROTONIX) injection 40 mg (has no administration in time range)  potassium chloride 10 mEq in 100 mL IVPB (has no administration in time range)  pantoprazole (PROTONIX) injection 40 mg (40 mg Intravenous Given 01/21/19 1506)  sodium chloride 0.9 % bolus 250 mL (250 mLs Intravenous New Bag/Given 01/21/19 1631)  pantoprazole (PROTONIX) 80 mg in sodium chloride 0.9 % 100 mL IVPB (0 mg Intravenous Stopped 01/21/19 1652)    Mobility walks Low fall risk   Focused Assessments Cardiac Assessment Handoff:    No results found for: CKTOTAL, CKMB, CKMBINDEX, TROPONINI No results found for: DDIMER Does the Patient currently have chest pain? No      R Recommendations: See Admitting Provider Note  Report given to:   Additional Notes: hard of hearing, slight dementia-pt does not know why she is here

## 2019-01-21 NOTE — ED Notes (Signed)
PT assisted to restroom with steady gate

## 2019-01-21 NOTE — Progress Notes (Signed)
Care Alignment Note  Advanced Directives Documents (Living Will, Power of Attorney) currently in the EHR no advanced directives documents available .  Has the patient discussed their wishes with their family/healthcare power of attorney yes. How much does the family or healthcare power of attorney know about their wishes. Patient's healthcare power of attorney and daughter Ms. Ernst Spell present at bedside confirmed her CODE STATUS to be DNR/DNI  What does the patient/decision maker understand about their medical condition and the natural course of their disease.  GI bleed.  Dementia.  Gastroesophageal reflux disease.  What is the patient/decision maker's biggest fear or concern for the future pain and suffering   What is the most important goal for this patient should their health condition worsen maintenance of function.  Current   Code Status: DNR  Current code status has been reviewed/updated.  Time spent:21 minutes

## 2019-01-22 ENCOUNTER — Inpatient Hospital Stay: Payer: PPO | Admitting: Registered Nurse

## 2019-01-22 ENCOUNTER — Encounter
Admission: EM | Disposition: A | Discharge status: Home / Self Care | Payer: Self-pay | Source: Home / Self Care | Attending: Internal Medicine

## 2019-01-22 DIAGNOSIS — K920 Hematemesis: Secondary | ICD-10-CM

## 2019-01-22 DIAGNOSIS — K2100 Gastro-esophageal reflux disease with esophagitis, without bleeding: Secondary | ICD-10-CM

## 2019-01-22 HISTORY — PX: ESOPHAGOGASTRODUODENOSCOPY: SHX5428

## 2019-01-22 LAB — CBC
HCT: 38.3 % (ref 36.0–46.0)
Hemoglobin: 12.4 g/dL (ref 12.0–15.0)
MCH: 28.4 pg (ref 26.0–34.0)
MCHC: 32.4 g/dL (ref 30.0–36.0)
MCV: 87.8 fL (ref 80.0–100.0)
Platelets: 138 10*3/uL — ABNORMAL LOW (ref 150–400)
RBC: 4.36 MIL/uL (ref 3.87–5.11)
RDW: 14.9 % (ref 11.5–15.5)
WBC: 7.1 10*3/uL (ref 4.0–10.5)
nRBC: 0 % (ref 0.0–0.2)

## 2019-01-22 LAB — BASIC METABOLIC PANEL
Anion gap: 7 (ref 5–15)
BUN: 40 mg/dL — ABNORMAL HIGH (ref 8–23)
CO2: 26 mmol/L (ref 22–32)
Calcium: 7.9 mg/dL — ABNORMAL LOW (ref 8.9–10.3)
Chloride: 105 mmol/L (ref 98–111)
Creatinine, Ser: 0.9 mg/dL (ref 0.44–1.00)
GFR calc Af Amer: >60 mL/min (ref >60)
GFR calc non Af Amer: 54 mL/min — ABNORMAL LOW (ref >60)
Glucose, Bld: 103 mg/dL — ABNORMAL HIGH (ref 70–99)
Potassium: 3.2 mmol/L — ABNORMAL LOW (ref 3.5–5.1)
Sodium: 138 mmol/L (ref 135–145)

## 2019-01-22 LAB — MAGNESIUM: Magnesium: 2 mg/dL (ref 1.7–2.4)

## 2019-01-22 LAB — HEMOGLOBIN AND HEMATOCRIT, BLOOD
HCT: 38.7 % (ref 36.0–46.0)
Hemoglobin: 12.5 g/dL (ref 12.0–15.0)

## 2019-01-22 LAB — MRSA PCR SCREENING: MRSA by PCR: NEGATIVE

## 2019-01-22 SURGERY — EGD (ESOPHAGOGASTRODUODENOSCOPY)
Anesthesia: General | Laterality: Left

## 2019-01-22 MED ORDER — LIDOCAINE HCL (CARDIAC) PF 100 MG/5ML IV SOSY
PREFILLED_SYRINGE | INTRAVENOUS | Status: DC | PRN
  Administered 2019-01-22: 60 mg via INTRAVENOUS

## 2019-01-22 MED ORDER — PROPOFOL 10 MG/ML IV BOLUS
INTRAVENOUS | Status: AC
  Filled 2019-01-22: qty 20

## 2019-01-22 MED ORDER — PHENYLEPHRINE HCL (PRESSORS) 10 MG/ML IV SOLN
INTRAVENOUS | Status: DC | PRN
  Administered 2019-01-22: 100 ug via INTRAVENOUS

## 2019-01-22 MED ORDER — POTASSIUM CHLORIDE CRYS ER 20 MEQ PO TBCR
40.0000 meq | EXTENDED_RELEASE_TABLET | Freq: Once | ORAL | Status: AC
  Administered 2019-01-22: 40 meq via ORAL
  Filled 2019-01-22: qty 2

## 2019-01-22 MED ORDER — LIDOCAINE HCL (PF) 2 % IJ SOLN
INTRAMUSCULAR | Status: AC
  Filled 2019-01-22: qty 10

## 2019-01-22 MED ORDER — PROPOFOL 500 MG/50ML IV EMUL
INTRAVENOUS | Status: DC | PRN
  Administered 2019-01-22: 50 ug/kg/min via INTRAVENOUS

## 2019-01-22 MED ORDER — SODIUM CHLORIDE 0.9 % IV SOLN: INTRAVENOUS | Status: DC

## 2019-01-22 MED ORDER — PANTOPRAZOLE SODIUM 40 MG PO TBEC: 40.0000 mg | DELAYED_RELEASE_TABLET | Freq: Every day | ORAL | 0 refills | Status: DC

## 2019-01-22 MED ORDER — SODIUM CHLORIDE 0.9 % IV SOLN
INTRAVENOUS | Status: DC | PRN
  Administered 2019-01-22: 12:00:00 via INTRAVENOUS

## 2019-01-22 MED ORDER — PROPOFOL 10 MG/ML IV BOLUS
INTRAVENOUS | Status: DC | PRN
  Administered 2019-01-22 (×2): 20 mg via INTRAVENOUS
  Administered 2019-01-22: 10 mg via INTRAVENOUS

## 2019-01-22 NOTE — Consult Note (Signed)
[name], MD [address], [place], [place], [address] [address], [place], [place], [address] Phone: [phone]  Fax: [phone]  Consultation  Referring Provider:     Dr. Elpidio Anis Primary Care Physician:  Marisue Ivan, MD Reason for Consultation:    Coffee-ground emesis  Date of Admission:  01/21/2019 Date of Consultation:  01/22/2019         HPI:   Megan Long is a 85 y.o. female with history of dementia, brought from nursing home due to coffee-ground emesis and dark stools.  History obtained from patient's daughter who makes her medical decisions, and describes seeing black material all around her, on her clothes, around her mouth, on her bed.  They deny any previous history of GI bleed or upper endoscopy.  They deny any over-the-counter pain medications, cold or cough supplements or any NSAIDs whatsoever.  She states the only new medication she was taking was vitamin D, and other than that no other medications.  They denied any evidence of red blood from her rectum or on emesis.  Has not had any further episodes of emesis since being in the hospital.  The patient denies abdominal or flank pain, anorexia, dysphagia, change in bowel habits.  Past Medical History:  Diagnosis Date  . Dementia (HCC)     History reviewed. No pertinent surgical history.  Prior to Admission medications   Medication Sig Start Date End Date Taking? Authorizing Provider  cholecalciferol (VITAMIN D3) 25 MCG (1000 UT) tablet Take 1,000 Units by mouth 3 (three) times a week.   Yes [provider]    No family history on file.   Social History   Tobacco Use  . Smoking status: Not on file  Substance Use Topics  . Alcohol use: Not on file  . Drug use: Not on file    Allergies as of 01/21/2019  . (No Known Allergies)    Review of Systems:    All systems reviewed and negative except where noted in HPI.   Physical Exam:  Vital signs in last  24 hours: Vitals:   01/21/19 1800 01/21/19 1900 01/21/19 2011 01/22/19 0655  BP: 129/72 (!) 156/72 (!) 143/56 110/63  Pulse: 66 69 65 63  Resp: 19 (!) 22 18   Temp:   98.4 F (36.9 C) 97.6 F (36.4 C)  TempSrc:   Oral Oral  SpO2: 93% 97% 95% 93%  Weight:      Height:       Last BM Date: 01/21/19(per report) General:   Pleasant, cooperative in NAD Head:  Normocephalic and atraumatic. Eyes:   No icterus.   Conjunctiva pink. PERRLA. Ears:  Normal auditory acuity. Neck:  Supple; no masses or thyroidomegaly Lungs: Respirations even and unlabored. Lungs clear to auscultation bilaterally.   No wheezes, crackles, or rhonchi.  Abdomen:  Soft, nondistended, nontender. Normal bowel sounds. No appreciable masses or hepatomegaly.  No rebound or guarding.  Neurologic:  Alert and oriented;  grossly normal neurologically. Skin:  Intact without significant lesions or rashes. Cervical Nodes:  No significant cervical adenopathy. Psych:  Alert and cooperative. Normal affect.  LAB RESULTS: Recent Labs    01/21/19 1450 01/21/19 2002 01/22/19 0152  WBC 11.8*  --  7.1  HGB 14.8 13.7 12.4  12.5  HCT 45.9 42.1 38.3  38.7  PLT 198  --  138*   BMET Recent Labs    01/21/19 1450 01/22/19 0152  NA 140 138  K 3.4* 3.2*  CL 100 105  CO2 29 26  GLUCOSE 110* 103*  BUN 53* 40*  CREATININE 1.24* 0.90  CALCIUM 9.1 7.9*   LFT Recent Labs    01/21/19 1450  PROT 6.9  ALBUMIN 3.8  AST 24  ALT 12  ALKPHOS 89  BILITOT 1.6*   PT/INR Recent Labs    01/21/19 1450  LABPROT 13.6  INR 1.1    STUDIES: Dg Abdomen Acute W/chest  Result Date: 01/21/2019 CLINICAL DATA:  Nausea, vomiting. EXAM: DG ABDOMEN ACUTE W/ 1V CHEST COMPARISON:  Radiographs of February 15, 2012. FINDINGS: There is no evidence of dilated bowel loops or free intraperitoneal air. No radiopaque calculi or other significant radiographic abnormality is seen. Stable cardiomegaly. Large hiatal hernia is noted. Atherosclerosis of  thoracic aorta is noted. Both lungs are clear. IMPRESSION: No evidence of bowel obstruction or ileus. Large hiatal hernia is noted. No acute cardiopulmonary disease. Aortic Atherosclerosis (ICD10-I70.0). Electronically Signed   By: Lupita RaiderJames  Green Jr M.D.   On: 01/21/2019 15:51      Impression / Plan:   Megan Long is a 85 y.o. y/o female with coffee-ground emesis and dark stools  Coffee-ground emesis by itself is not specific for GI bleed  However, patient's daughter specifically describes seeing black material all throughout the room.  It is unclear if the black material came from emesis or from bowel movements.  We extensively discussed that further management options include proceeding with upper endoscopy today to rule out esophagitis, peptic ulcer disease or any other lesions causing the coffee-ground emesis and possible black stools.  However, since her hemoglobin is very stable, even normal, we could manage her with conservative management with PPI alone and the symptoms resolve, she may not need to undergo an upper endoscopy at her age.  However, she does not have any chronic medical problems besides dementia, and is in good health to tolerate an upper endoscopy and lead to a diagnosis for her symptoms.  Patient daughter prefers to proceed with upper endoscopy at this time as she states she would like to know what occurred to cause her symptoms.  I have discussed alternative options, risks & benefits,  which include, but are not limited to, bleeding, infection, perforation,respiratory complication & drug reaction.  The patient agrees with this plan & written consent will be obtained.    PPI IV twice daily  Continue serial CBCs and transfuse PRN Avoid NSAIDs Maintain 2 large-bore IV lines Please page GI with any acute hemodynamic changes, or signs of active GI bleeding    Thank you for involving me in the care of this patient.      LOS: 1 day   Pasty SpillersVarnita B Tahiliani, MD   01/22/2019, 10:28 AM

## 2019-01-22 NOTE — Op Note (Signed)
[facility] Patient Name: Megan Long Procedure Date: 01/22/2019 12:06 PM MRN: [id number] Account #: [id number] Date of Birth: 09/22/1921 Admit Type: Inpatient Age: 86 Room: [facility] ENDO ROOM 4 Gender: Female Note Status: Finalized Procedure:            Upper GI endoscopy Indications:          Coffee-ground emesis Providers:            Keion Neels B. Maximino Greenland MD, MD Referring MD:         Marisue Ivan (Referring MD) Medicines:            Monitored Anesthesia Care Complications:        No immediate complications. Procedure:            Pre-Anesthesia Assessment:                       - The risks and benefits of the procedure and the                        sedation options and risks were discussed with the                        patient. All questions were answered and informed                        consent was obtained.                       - Patient identification and proposed procedure were                        verified prior to the procedure.                       - ASA Grade Assessment: III - A patient with severe                        systemic disease.                       After obtaining informed consent, the endoscope was                        passed under direct vision. Throughout the procedure,                        the patient's blood pressure, pulse, and oxygen                        saturations were monitored continuously. The Endoscope                        was introduced through the mouth, and advanced to the                        second part of duodenum. The upper GI endoscopy was                        accomplished with ease. The patient tolerated the  procedure well. Findings:      LA Grade D (one or more mucosal breaks involving at least 75% of       esophageal circumference) esophagitis with no bleeding was found in the       distal esophagus.      A large hiatal hernia was present.      The exam  of the stomach was otherwise normal.      The examined duodenum was normal. Impression:           - LA Grade D reflux esophagitis.                       - Large hiatal hernia.                       - Normal examined duodenum.                       - No specimens collected. Recommendation:       - Follow an antireflux regimen.                       - Use Protonix (pantoprazole) 40 mg PO BID for 8 weeks.                       - Refer to a surgeon at appointment to be scheduled.                       - The findings and recommendations were discussed with                        the patient.                       - The findings and recommendations were discussed with                        the patient's family.                       - Soft diet. Procedure Code(s):    --- Professional ---                       [phone] [id number], Esophagogastroduodenoscopy, flexible, transoral;                        diagnostic, including collection of specimen(s) by                        brushing or washing, when performed (separate procedure) Diagnosis Code(s):    --- Professional ---                       K21.0, Gastro-esophageal reflux disease with esophagitis                       K44.9, Diaphragmatic hernia without obstruction or                        gangrene                       K92.0, Hematemesis CPT copyright 2019 [employer]. All  rights reserved. The codes documented in this report are preliminary and upon coder review may  be revised to meet current compliance requirements.  [name], MD Michel Bickers B. Maximino Greenland MD, MD 01/22/2019 12:35:51 PM This report has been signed electronically. Number of Addenda: 0 Note Initiated On: 01/22/2019 12:06 PM      [facility]

## 2019-01-22 NOTE — Evaluation (Signed)
Physical Therapy Evaluation Patient Details Name: Megan Long MRN: [id number] DOB: [birth year] Today's Date: 01/22/2019   History of Present Illness  85 y.o. female with a known history of gastroesophageal reflux disease and dementia who was brought in from nursing home with complaints of coffee-ground emesis and having dark stools   Clinical Impression  Pt did well with all aspects of PT care, she showed good safety and confidence with bed mobility and ~125 ft of ambulation.  She had surprisingly good strength and mobility and overall was at or near her baseline.  Pt did not have any safety concerns t/o the session and she and daughter agree that she can return to her ALF safely w/o PT follow up.    Follow Up Recommendations No PT follow up    Equipment Recommendations  None recommended by PT    Recommendations for Other Services       Precautions / Restrictions Precautions Precautions: Fall Restrictions Weight Bearing Restrictions: No      Mobility  Bed Mobility Overal bed mobility: Independent             General bed mobility comments: easily gets to EOB w/o assist  Transfers Overall transfer level: Independent Equipment used: Chief Financial Officer transfer comment: able to rise w/o assist and actually maintained balance w/o AD initially  Ambulation/Gait Ambulation/Gait assistance: Supervision Gait Distance (Feet): 125 Feet Assistive device: 4-wheeled walker       General Gait Details: Pt is able to ambulate with easy confidence and impressive speed with 4WW while remaining safe.  She did not have any fatigue with the effort and HR remained in the 80s.  No LOBs and per daughter she appears at or very near her baseline.  Stairs            Wheelchair Mobility    Modified Rankin (Stroke Patients Only)       Balance Overall balance assessment: Independent   Sitting balance-Leahy Scale: Normal       Standing balance-Leahy  Scale: Good Standing balance comment: able to maintain static standing w/o AD                             Pertinent Vitals/Pain Pain Assessment: No/denies pain(daughter states she was having minimal LBP/soreness)    Home Living Family/patient expects to be discharged to:: Assisted living([facility])               Home Equipment: Dan Humphreys - 4 wheels      Prior Function Level of Independence: Independent with assistive device(s)         Comments: facility provides meals, family does laundry, pt able to bath, use restroom, do exercise classes, etc     Hand Dominance        Extremity/Trunk Assessment   Upper Extremity Assessment Upper Extremity Assessment: Overall WFL for tasks assessed    Lower Extremity Assessment Lower Extremity Assessment: Overall WFL for tasks assessed       Communication   Communication: HOH  Cognition Arousal/Alertness: Awake/alert Behavior During Therapy: WFL for tasks assessed/performed Overall Cognitive Status: History of cognitive impairments - at baseline                                 General Comments: Pt with mild confusion, pleasant and able to participate  well      General Comments      Exercises     Assessment/Plan    PT Assessment Patent does not need any further PT services  PT Problem List         PT Treatment Interventions      PT Goals (Current goals can be found in the Care Plan section)  Acute Rehab PT Goals Patient Stated Goal: [facility] PT Goal Formulation: All assessment and education complete, DC therapy    Frequency     Barriers to discharge        Co-evaluation               AM-PAC PT "6 Clicks" Mobility  Outcome Measure Help needed turning from your back to your side while in a flat bed without using bedrails?: None Help needed moving from lying on your back to sitting on the side of a flat bed without using bedrails?: None Help needed  moving to and from a bed to a chair (including a wheelchair)?: None Help needed standing up from a chair using your arms (e.g., wheelchair or bedside chair)?: None Help needed to walk in hospital room?: None Help needed climbing 3-5 steps with a railing? : None 6 Click Score: 24    End of Session Equipment Utilized During Treatment: Gait belt Activity Tolerance: Patient tolerated treatment well Patient left: with chair alarm set;with call bell/phone within reach;with family/visitor present   PT Visit Diagnosis: Muscle weakness (generalized) (M62.81);Difficulty in walking, not elsewhere classified (R26.2)    Time: [id number] PT Time Calculation (min) (ACUTE ONLY): 23 min   Charges:   PT Evaluation $PT Eval Low Complexity: 1 Low          Malachi ProGalen R Sienna Stonehocker, DPT 01/22/2019, 3:43 PM

## 2019-01-22 NOTE — Progress Notes (Signed)
Patient returned to room- 2C 201 s/p EGD, large hiatal hernia and esophagitis per nursing report; patient tolerated well.  Returned to bed, alert to self, hx dementia. Pleasant with daughter at bedside. Bed alarm on. VSS.    01/22/19 1342  Vitals  Temp 97.6 F (36.4 C)  Temp Source Oral  BP (!) 147/55  MAP (mmHg) 80  BP Location Left Arm  BP Method Automatic  Patient Position (if appropriate) Lying  Pulse Rate (!) 56  Resp 16  Oxygen Therapy  SpO2 94 %  MEWS Score  MEWS RR 0  MEWS Pulse 0  MEWS Systolic 0  MEWS LOC 0  MEWS Temp 0  MEWS Score 0  MEWS Score Color Green

## 2019-01-22 NOTE — Clinical Social Work Note (Signed)
CSW received referral for patient PT worked with patient and she does not have any needs.  CSW to sign off please re-consult if social work needs arise.  Ervin Knack. Timaya Bojarski, MSW, LCSW [phone]

## 2019-01-22 NOTE — Transfer of Care (Signed)
Immediate Anesthesia Transfer of Care Note  Patient: Megan Long  Procedure(s) Performed: ESOPHAGOGASTRODUODENOSCOPY (EGD) (Left )  Patient Location: Endoscopy Unit  Anesthesia Type:General  Level of Consciousness: drowsy and patient cooperative  Airway & Oxygen Therapy: Patient Spontanous Breathing and Patient connected to nasal cannula oxygen  Post-op Assessment: Report given to RN and Post -op Vital signs reviewed and stable  Post vital signs: Reviewed and stable  Last Vitals:  Vitals Value Taken Time  BP 93/57 01/22/2019 12:32 PM  Temp 36.4 C 01/22/2019 12:27 PM  Pulse 64 01/22/2019 12:33 PM  Resp 17 01/22/2019 12:33 PM  SpO2 96 % 01/22/2019 12:33 PM  Vitals shown include unvalidated device data.  Last Pain:  Vitals:   01/22/19 1227  TempSrc: Tympanic  PainSc: Asleep         Complications: No apparent anesthesia complications

## 2019-01-22 NOTE — Progress Notes (Signed)
[facility] - [place] at [facility]   PATIENT NAME: Megan Long    MR#:  [id number]  DATE OF BIRTH:  06-09-1922  SUBJECTIVE:  CHIEF COMPLAINT:   Chief Complaint  Patient presents with  . GI Problem  . Hematemesis   No further vomiting.  Patient is confused with dementia.  Daughter at bedside gives history.  REVIEW OF SYSTEMS:    Review of Systems  Unable to perform ROS: Dementia   DRUG ALLERGIES:  No Known Allergies  VITALS:  Blood pressure (!) 147/55, pulse (!) 56, temperature 97.6 F (36.4 C), temperature source Oral, resp. rate 16, height 4\' 11"  (1.499 m), weight 54.4 kg, SpO2 94 %.  PHYSICAL EXAMINATION:   Physical Exam  GENERAL:  85 y.o.-year-old patient lying in the bed with no acute distress.  EYES: Pupils equal, round, reactive to light and accommodation. No scleral icterus. Extraocular muscles intact.  HEENT: Head atraumatic, normocephalic. Oropharynx and nasopharynx clear.  NECK:  Supple, no jugular venous distention. No thyroid enlargement, no tenderness.  LUNGS: Normal breath sounds bilaterally, no wheezing, rales, rhonchi. No use of accessory muscles of respiration.  CARDIOVASCULAR: S1, S2 normal. No murmurs, rubs, or gallops.  ABDOMEN: Soft, nontender, nondistended. Bowel sounds present. No organomegaly or mass.  EXTREMITIES: No cyanosis, clubbing or edema b/l.    NEUROLOGIC: Cranial nerves II through XII are intact. No focal Motor or sensory deficits b/l.   PSYCHIATRIC: The patient is alert and awake.  Pleasantly confused SKIN: No obvious rash, lesion, or ulcer.   LABORATORY PANEL:   CBC Recent Labs  Lab 01/22/19 0152  WBC 7.1  HGB 12.4  12.5  HCT 38.3  38.7  PLT 138*   ------------------------------------------------------------------------------------------------------------------ Chemistries  Recent Labs  Lab 01/21/19 1450 01/22/19 0152  NA 140 138  K 3.4* 3.2*  CL 100 105  CO2 29 26  GLUCOSE 110* 103*  BUN 53* 40*   CREATININE 1.24* 0.90  CALCIUM 9.1 7.9*  MG  --  2.0  AST 24  --   ALT 12  --   ALKPHOS 89  --   BILITOT 1.6*  --    ------------------------------------------------------------------------------------------------------------------  Cardiac Enzymes No results for input(s): TROPONINI in the last 168 hours. ------------------------------------------------------------------------------------------------------------------  RADIOLOGY:  Dg Abdomen Acute W/chest  Result Date: 01/21/2019 CLINICAL DATA:  Nausea, vomiting. EXAM: DG ABDOMEN ACUTE W/ 1V CHEST COMPARISON:  Radiographs of February 15, 2012. FINDINGS: There is no evidence of dilated bowel loops or free intraperitoneal air. No radiopaque calculi or other significant radiographic abnormality is seen. Stable cardiomegaly. Large hiatal hernia is noted. Atherosclerosis of thoracic aorta is noted. Both lungs are clear. IMPRESSION: No evidence of bowel obstruction or ileus. Large hiatal hernia is noted. No acute cardiopulmonary disease. Aortic Atherosclerosis (ICD10-I70.0). Electronically Signed   By: Lupita RaiderJames  Green Jr M.D.   On: 01/21/2019 15:51     ASSESSMENT AND PLAN:   *Upper GI bleed.  Waiting for EGD.  On IV Protonix.  N.p.o.  *Dementia.  Monitor for inpatient delirium.  *Hypokalemia.  Will replace orally.  *Elevated blood pressure with no diagnosis of hypertension. Will monitor.  Add medications if consistently elevated.  All the records are reviewed and case discussed with Care Management/Social Workerr. Management plans discussed with the patient, family and they are in agreement.  CODE STATUS: DNR/DNI  DVT Prophylaxis: SCDs  TOTAL TIME TAKING CARE OF THIS PATIENT: 35 minutes.   POSSIBLE D/C IN 1-2 DAYS, DEPENDING ON CLINICAL CONDITION.  Lashana Spang R Terrin Imparato M.D  on 01/22/2019 at 2:02 PM  Between 7am to 6pm - Pager - [phone]  After 6pm go to [email or web address] - password [email or web address] [facility]  [facility] [place] [facility]  Office   [phone]  CC: Primary care physician; Marisue Ivan, MD  Note: This dictation was prepared with Dragon dictation along with smaller phrase technology. Any transcriptional errors that result from this process are unintentional.

## 2019-01-22 NOTE — Discharge Instructions (Addendum)
Resume diet and activity as before ° ° °

## 2019-01-22 NOTE — Anesthesia Postprocedure Evaluation (Signed)
Anesthesia Post Note  Patient: Megan Long  Procedure(s) Performed: ESOPHAGOGASTRODUODENOSCOPY (EGD) (Left )  Patient location during evaluation: Endoscopy Anesthesia Type: General Level of consciousness: awake and alert Pain management: pain level controlled Vital Signs Assessment: post-procedure vital signs reviewed and stable Respiratory status: spontaneous breathing, nonlabored ventilation and respiratory function stable Cardiovascular status: blood pressure returned to baseline and stable Postop Assessment: no signs of nausea or vomiting Anesthetic complications: no     Last Vitals:  Vitals:   01/22/19 1257 01/22/19 1307  BP: (!) 140/52 (!) 148/53  Pulse: 64 (!) 58  Resp: (!) 22 20  Temp:    SpO2: 91% 90%    Last Pain:  Vitals:   01/22/19 1307  TempSrc:   PainSc: 0-No pain                 Donnis Phaneuf

## 2019-01-22 NOTE — Anesthesia Post-op Follow-up Note (Signed)
Anesthesia QCDR form completed.        

## 2019-01-22 NOTE — Anesthesia Preprocedure Evaluation (Signed)
Anesthesia Evaluation  Patient identified by MRN, date of birth, ID band Patient awake    Reviewed: Allergy & Precautions, NPO status , Patient's Chart, lab work & pertinent test results  History of Anesthesia Complications Negative for: history of anesthetic complications  Airway Mallampati: II  TM Distance: >3 FB Neck ROM: Full    Dental no notable dental hx.    Pulmonary neg pulmonary ROS, neg sleep apnea, neg COPD,    breath sounds clear to auscultation- rhonchi (-) wheezing      Cardiovascular (-) hypertension(-) CAD, (-) Past MI, (-) Cardiac Stents and (-) CABG  Rhythm:Regular Rate:Normal - Systolic murmurs and - Diastolic murmurs    Neuro/Psych neg Seizures PSYCHIATRIC DISORDERS Dementia negative neurological ROS     GI/Hepatic Neg liver ROS, GIB    Endo/Other  negative endocrine ROSneg diabetes  Renal/GU negative Renal ROS     Musculoskeletal negative musculoskeletal ROS (+)   Abdominal (+) - obese,   Peds  Hematology negative hematology ROS (+)   Anesthesia Other Findings    Reproductive/Obstetrics                             Anesthesia Physical Anesthesia Plan  ASA: II  Anesthesia Plan: General   Post-op Pain Management:    Induction: Intravenous  PONV Risk Score and Plan: 2 and Propofol infusion  Airway Management Planned: Natural Airway  Additional Equipment:   Intra-op Plan:   Post-operative Plan:   Informed Consent: I have reviewed the patients History and Physical, chart, labs and discussed the procedure including the risks, benefits and alternatives for the proposed anesthesia with the patient or authorized representative who has indicated his/her understanding and acceptance.   Patient has DNR.  Discussed DNR with power of attorney and Suspend DNR.   Dental advisory given  Plan Discussed with: CRNA and Anesthesiologist  Anesthesia Plan Comments:          Anesthesia Quick Evaluation

## 2019-01-23 DIAGNOSIS — F0390 Unspecified dementia without behavioral disturbance: Secondary | ICD-10-CM | POA: Diagnosis not present

## 2019-01-27 ENCOUNTER — Other Ambulatory Visit: Payer: Self-pay | Admitting: Gastroenterology

## 2019-01-27 ENCOUNTER — Telehealth: Payer: Self-pay | Admitting: Gastroenterology

## 2019-01-27 MED ORDER — PANTOPRAZOLE SODIUM 40 MG PO TBEC: 40.0000 mg | DELAYED_RELEASE_TABLET | Freq: Two times a day (BID) | ORAL | 1 refills | Status: AC

## 2019-01-27 NOTE — Telephone Encounter (Signed)
Left message that amount of tabs have been corrected and sent to pharmacy. To contact office for any further questions.

## 2019-01-27 NOTE — Telephone Encounter (Signed)
Pt daughter left vm for Dr. Maximino Greenland pt had endoscopy Friday May 22nd and was prescript rx Pantoprazole 40 mg 2 times a day but there are only 30 Tablets in the bottle with no refills please advise

## 2019-01-28 DIAGNOSIS — L600 Ingrowing nail: Secondary | ICD-10-CM | POA: Diagnosis not present

## 2019-01-28 DIAGNOSIS — F0390 Unspecified dementia without behavioral disturbance: Secondary | ICD-10-CM | POA: Diagnosis not present

## 2019-01-28 DIAGNOSIS — K210 Gastro-esophageal reflux disease with esophagitis: Secondary | ICD-10-CM | POA: Diagnosis not present

## 2019-01-28 DIAGNOSIS — R2681 Unsteadiness on feet: Secondary | ICD-10-CM | POA: Diagnosis not present

## 2019-02-01 NOTE — Discharge Summary (Signed)
[facility] - [place] at [facility]   PATIENT NAME: Megan Long    MR#:  [id number]  DATE OF BIRTH:  May 16, 1922  DATE OF ADMISSION:  01/21/2019 ADMITTING PHYSICIAN: Jama Flavors, MD  DATE OF DISCHARGE: 01/22/2019  6:29 PM  PRIMARY CARE PHYSICIAN: Marisue Ivan, MD   ADMISSION DIAGNOSIS:  Coffee ground emesis [K92.0]  DISCHARGE DIAGNOSIS:  Active Problems:   GI bleed   Coffee ground emesis   Reflux esophagitis   SECONDARY DIAGNOSIS:   Past Medical History:  Diagnosis Date  . Dementia (HCC)      ADMITTING HISTORY  HISTORY OF PRESENT ILLNESS:  Megan Long  is a 85 y.o. female with a known history of gastroesophageal reflux disease and dementia who was brought in from nursing home with complaints of coffee-ground emesis and having dark stools that started last night.  Patient was evaluated by doctors making house call and reported to have evidence of coffee-ground emesis.  Patient not on any blood thinners.  No prior history of GI bleed.  No abdominal pain.  Daughter at bedside also confirmed patient had some dark stools.  No further vomiting or dark stools today.  Patient was evaluated in the emergency room and found to be hemodynamically stable.  Hemoglobin stable at 14.8.  Abdominal /chest x-ray done with no evidence of bowel obstruction or ileus.  Large hiatal hernia noted.  No acute cardiopulmonary disease.  Medical service called to admit patient for further evaluation and management.   HOSPITAL COURSE:   * Upper GI bleed.   Patient admitted to the medical floor.  Did not need any blood transfusions. EGD showed Impression:           - LA Grade D reflux esophagitis.                       - Large hiatal hernia.                       - Normal examined duodenum.                       - No specimens collected. Recommendation:       - Follow an antireflux regimen.                       - Use Protonix (pantoprazole)  No further bleeding in the hospital.   Tolerated diet prior to discharge.  Given prescription for Protonix.  * Dementia.  Monitor for inpatient delirium.  * Hypokalemia.    Placed orally  * Elevated blood pressure with no diagnosis of hypertension. Did not have any significant elevation prior to discharge.  Patient discharged home in stable condition to follow-up with GI as outpatient and given prescription for Protonix and iron supplements.   CONSULTS OBTAINED:    DRUG ALLERGIES:  No Known Allergies  DISCHARGE MEDICATIONS:   Allergies as of 01/22/2019   No Known Allergies     Medication List    TAKE these medications   cholecalciferol 25 MCG (1000 UT) tablet Commonly known as:  VITAMIN D3 Take 1,000 Units by mouth 3 (three) times a week.       Today   VITAL SIGNS:  Blood pressure (!) 147/55, pulse (!) 56, temperature 97.6 F (36.4 C), temperature source Oral, resp. rate 16, height 4\' 11"  (1.499 m), weight 54.4 kg, SpO2 94 %.  I/O:  No intake or  output data in the 24 hours ending 02/01/19 1411  PHYSICAL EXAMINATION:  Physical Exam  GENERAL:  85 y.o.-year-old patient lying in the bed with no acute distress.  LUNGS: Normal breath sounds bilaterally, no wheezing, rales,rhonchi or crepitation. No use of accessory muscles of respiration.  CARDIOVASCULAR: S1, S2 normal. No murmurs, rubs, or gallops.  ABDOMEN: Soft, non-tender, non-distended. Bowel sounds present. No organomegaly or mass.  NEUROLOGIC: Moves all 4 extremities. PSYCHIATRIC: The patient is alert and oriented x 3.  SKIN: No obvious rash, lesion, or ulcer.   DATA REVIEW:   CBC No results for input(s): WBC, HGB, HCT, PLT in the last 168 hours.  Chemistries  No results for input(s): NA, K, CL, CO2, GLUCOSE, BUN, CREATININE, CALCIUM, MG, AST, ALT, ALKPHOS, BILITOT in the last 168 hours.  Invalid input(s): GFRCGP  Cardiac Enzymes No results for input(s): TROPONINI in the last 168 hours.  Microbiology Results  Results for orders placed  or performed during the hospital encounter of 01/21/19  MRSA PCR Screening     Status: None   Collection Time: 01/21/19  2:49 PM  Result Value Ref Range Status   MRSA by PCR NEGATIVE NEGATIVE Final    Comment:        The GeneXpert MRSA Assay (FDA approved for NASAL specimens only), is one component of a comprehensive MRSA colonization surveillance program. It is not intended to diagnose MRSA infection nor to guide or monitor treatment for MRSA infections. Performed at [facility], [address], [place], [place] [address]   SARS Coronavirus 2 (CEPHEID - Performed in [facility] hospital lab), Hosp Order     Status: None   Collection Time: 01/21/19  3:19 PM  Result Value Ref Range Status   SARS Coronavirus 2 NEGATIVE NEGATIVE Final    Comment: (NOTE) If result is NEGATIVE SARS-CoV-2 target nucleic acids are NOT DETECTED. The SARS-CoV-2 RNA is generally detectable in upper and lower  respiratory specimens during the acute phase of infection. The lowest  concentration of SARS-CoV-2 viral copies this assay can detect is 250  copies / mL. A negative result does not preclude SARS-CoV-2 infection  and should not be used as the sole basis for treatment or other  patient management decisions.  A negative result may occur with  improper specimen collection / handling, submission of specimen other  than nasopharyngeal swab, presence of viral mutation(s) within the  areas targeted by this assay, and inadequate number of viral copies  (<250 copies / mL). A negative result must be combined with clinical  observations, patient history, and epidemiological information. If result is POSITIVE SARS-CoV-2 target nucleic acids are DETECTED. The SARS-CoV-2 RNA is generally detectable in upper and lower  respiratory specimens dur ing the acute phase of infection.  Positive  results are indicative of active infection with SARS-CoV-2.  Clinical  correlation with patient history and other  diagnostic information is  necessary to determine patient infection status.  Positive results do  not rule out bacterial infection or co-infection with other viruses. If result is PRESUMPTIVE POSTIVE SARS-CoV-2 nucleic acids MAY BE PRESENT.   A presumptive positive result was obtained on the submitted specimen  and confirmed on repeat testing.  While 2019 novel coronavirus  (SARS-CoV-2) nucleic acids may be present in the submitted sample  additional confirmatory testing may be necessary for epidemiological  and / or clinical management purposes  to differentiate between  SARS-CoV-2 and other Sarbecovirus currently known to infect humans.  If clinically indicated additional  testing with an alternate test  methodology [phone]([id number]) is advised. The SARS-CoV-2 RNA is generally  detectable in upper and lower respiratory sp ecimens during the acute  phase of infection. The expected result is Negative. Fact Sheet for Patients:  BoilerBrush.com.cyhttps://www.fda.gov/media/136312/download Fact Sheet for Healthcare Providers: https://pope.com/https://www.fda.gov/media/136313/download This test is not yet approved or cleared by the [place] FDA and has been authorized for detection and/or diagnosis of SARS-CoV-2 by FDA under an Emergency Use Authorization (EUA).  This EUA will remain in effect (meaning this test can be used) for the duration of the COVID-19 declaration under Section 564(b)(1) of the Act, 21 U.S.C. section 360bbb-3(b)(1), unless the authorization is terminated or revoked sooner. Performed at [facility], [address] [place], [place] [address]     RADIOLOGY:  No results found.  Follow up with PCP in 1 week.  Management plans discussed with the patient, family and they are in agreement.  CODE STATUS:  Code Status History    Date Active Date Inactive Code Status Order ID Comments User Context   01/21/2019 1801 01/22/2019 2129 DNR [id number]  Jama Flavorsjie, Jude, MD ED    Questions for Most Recent  Historical Code Status (Order [id number])    Question Answer Comment   In the event of cardiac or respiratory ARREST Do not call a "code blue"    In the event of cardiac or respiratory ARREST Do not perform Intubation, CPR, defibrillation or ACLS    In the event of cardiac or respiratory ARREST Use medication by any route, position, wound care, and other measures to relive pain and suffering. May use oxygen, suction and manual treatment of airway obstruction as needed for comfort.    Comments Patient's healthcare power of attorney Ms. Ernst SpellBrenda Tillman at bedside confirmed her CODE STATUS to be DNR/DNI         Advance Directive Documentation     Most Recent Value  Type of Advance Directive  Healthcare Power of Attorney  Pre-existing out of facility DNR order (yellow form or pink MOST form)  -  "MOST" Form in Place?  -      TOTAL TIME TAKING CARE OF THIS PATIENT ON DAY OF DISCHARGE: more than 30 minutes.   Molinda BailiffSrikar R Cashmere Dingley M.D on 02/01/2019 at 2:11 PM  Between 7am to 6pm - Pager - [phone]  After 6pm go to [email or web address] - password [email or web address] [facility]  [facility] [place] [facility]  Office  [phone]  CC: Primary care physician; Marisue IvanLinthavong, [place], MD  Note: This dictation was prepared with Dragon dictation along with smaller phrase technology. Any transcriptional errors that result from this process are unintentional.

## 2019-02-05 DIAGNOSIS — Z79899 Other long term (current) drug therapy: Secondary | ICD-10-CM | POA: Diagnosis not present

## 2019-02-16 ENCOUNTER — Ambulatory Visit (INDEPENDENT_AMBULATORY_CARE_PROVIDER_SITE_OTHER): Payer: PPO | Admitting: Gastroenterology

## 2019-02-16 ENCOUNTER — Other Ambulatory Visit: Payer: Self-pay

## 2019-02-16 DIAGNOSIS — K208 Other esophagitis without bleeding: Secondary | ICD-10-CM

## 2019-02-16 NOTE — Progress Notes (Signed)
Megan BouillonVarnita Solana Coggin, MD [address]  [address]  [place], [place] [address]  Main: [phone]  Fax: [phone]   Primary Care Physician: Megan IvanLinthavong, Kanhka, MD  Virtual Visit via Video Note  I connected with patient on 02/16/19 at 10:00 AM EDT by video (using [email or web address]) and verified that I am speaking with the correct person using two identifiers.   I discussed the limitations, risks, security and privacy concerns of performing an evaluation and management service by video and the availability of in person appointments. I also discussed with the patient that there may be a patient responsible charge related to this service. The patient expressed understanding and agreed to proceed.  Location of Patient: Home Location of Provider: Home Persons involved: Patient, daughter Megan Long, and provider only (Nursing staff checked in patient via phone but were not physically involved in the video interaction - see their notes)   History of Present Illness: Chief Complaint  Patient presents with  . Follow-up    Hospital f/u to EGD; Melena/esophagitis    HPI: Megan Long is a 85 y.o. female here for follow-up of grade D esophagitis.  Patient admitted in May 2020 with coffee-ground emesis and upper endoscopy showed grade D esophagitis.  Patient has been on PPI twice daily for 1 month now, and doing well. The patient denies abdominal or flank pain, anorexia, nausea or vomiting, dysphagia, change in bowel habits or black or bloody stools or weight loss.  Daughter states, they have a complete 8-week course of Protonix 40 twice daily already, and then after that the refills include once daily course as prescribed by Megan Long who is the physician who makes home visits.  Current Outpatient Medications  Medication Sig Dispense Refill  . cholecalciferol (VITAMIN D3) 25 MCG (1000 UT) tablet Take 1,000 Units by mouth 3 (three) times a week.    . pantoprazole (PROTONIX) 40 MG tablet Take 1 tablet  (40 mg total) by mouth 2 (two) times daily for 30 days. 60 tablet 1   No current facility-administered medications for this visit.     Allergies as of 02/16/2019  . (No Known Allergies)    Review of Systems:    All systems reviewed and negative except where noted in HPI.   Observations/Objective:  Labs: CMP     Component Value Date/Time   NA 138 01/22/2019 0152   K 3.2 (L) 01/22/2019 0152   CL 105 01/22/2019 0152   CO2 26 01/22/2019 0152   GLUCOSE 103 (H) 01/22/2019 0152   BUN 40 (H) 01/22/2019 0152   CREATININE 0.90 01/22/2019 0152   CALCIUM 7.9 (L) 01/22/2019 0152   PROT 6.9 01/21/2019 1450   ALBUMIN 3.8 01/21/2019 1450   AST 24 01/21/2019 1450   ALT 12 01/21/2019 1450   ALKPHOS 89 01/21/2019 1450   BILITOT 1.6 (H) 01/21/2019 1450   GFRNONAA 54 (L) 01/22/2019 0152   GFRAA >60 01/22/2019 0152   Lab Results  Component Value Date   WBC 7.1 01/22/2019   HGB 12.5 01/22/2019   HGB 12.4 01/22/2019   HCT 38.7 01/22/2019   HCT 38.3 01/22/2019   MCV 87.8 01/22/2019   PLT 138 (L) 01/22/2019    Imaging Studies: Dg Abdomen Acute W/chest  Result Date: 01/21/2019 CLINICAL DATA:  Nausea, vomiting. EXAM: DG ABDOMEN ACUTE W/ 1V CHEST COMPARISON:  Radiographs of February 15, 2012. FINDINGS: There is no evidence of dilated bowel loops or free intraperitoneal air. No radiopaque calculi or other significant radiographic abnormality is  seen. Stable cardiomegaly. Large hiatal hernia is noted. Atherosclerosis of thoracic aorta is noted. Both lungs are clear. IMPRESSION: No evidence of bowel obstruction or ileus. Large hiatal hernia is noted. No acute cardiopulmonary disease. Aortic Atherosclerosis (ICD10-I70.0). Electronically Signed   By: Marijo Conception M.D.   On: 01/21/2019 15:51    Assessment and Plan:   Megan Long is a 85 y.o. y/o female here for follow-up of grade D esophagitis  Assessment and Plan:  No further episodes of bleeding Continue PPI twice daily for total of 8  weeks  And then decrease to once daily  We did discuss options for the length of the once daily course.  We discussed the adverse effects of the medication, and the benefits.  Given the recent GI bleeding, in order to avoid repeat episodes in this elderly female, the benefits of continuing PPI at least for short-term outweigh the risks.  However, the daughter states that patient has history of dementia, and she would like to avoid any medications that would exacerbate her dementia.  We discussed that PPI data has conflicting reports of dementia associated with the medication.  However, daughter has not noted any changes in mental status from the patient's baseline since the medication has been started and does not want to continue it for the short-term given its benefits at this time.  Therefore, it would likely be best to continue the once daily dosing only for 1 to 2 months after the twice daily dosing to prevent any long-term side effects.  Patient and family agreeable  Patient already has refills for the once daily dosing after she completes her twice daily dosing from Dr. Tamala Julian  (Risks of PPI use were discussed with patient including bone loss, C. Diff diarrhea, pneumonia, infections, CKD, electrolyte abnormalities.  If clinically possible based on symptoms, goal would be to maintain patient on the lowest dose possible, or discontinue the medication with institution of acid reflux lifestyle modifications over time. Pt. Verbalizes understanding and chooses to continue the medication.)  We did discuss that typically after grade D esophagitis is seen, repeat endoscopy helps [place] evaluate that it has completely resolved and that there is no underlying Barrett's.  However, the rest of the procedure and her age will need to be taken into consideration for need of any procedures.  We discussed risks and benefits of procedure in detail.  Patient and family are not interested in the repeat  procedure given her age, and would like to continue with medication management.  Follow Up Instructions: Follow-up in 2 to 3 months   I discussed the assessment and treatment plan with the patient. The patient was provided an opportunity to ask questions and all were answered. The patient agreed with the plan and demonstrated an understanding of the instructions.   The patient was advised to call back or seek an in-person evaluation if the symptoms worsen or if the condition fails to improve as anticipated.  I provided 20 minutes of face-to-face time via video software during this encounter. Additional time was spent in reviewing patient's chart, placing orders etc.   Virgel Manifold, MD  Speech recognition software was used to dictate this note.

## 2019-02-18 ENCOUNTER — Ambulatory Visit: Payer: PPO | Admitting: Gastroenterology

## 2019-03-09 DIAGNOSIS — I739 Peripheral vascular disease, unspecified: Secondary | ICD-10-CM | POA: Diagnosis not present

## 2019-03-09 DIAGNOSIS — B351 Tinea unguium: Secondary | ICD-10-CM | POA: Diagnosis not present

## 2019-03-25 DIAGNOSIS — L600 Ingrowing nail: Secondary | ICD-10-CM | POA: Diagnosis not present

## 2019-03-25 DIAGNOSIS — F0390 Unspecified dementia without behavioral disturbance: Secondary | ICD-10-CM | POA: Diagnosis not present

## 2019-03-25 DIAGNOSIS — R410 Disorientation, unspecified: Secondary | ICD-10-CM | POA: Diagnosis not present

## 2019-03-25 DIAGNOSIS — R399 Unspecified symptoms and signs involving the genitourinary system: Secondary | ICD-10-CM | POA: Diagnosis not present

## 2019-03-25 DIAGNOSIS — K210 Gastro-esophageal reflux disease with esophagitis: Secondary | ICD-10-CM | POA: Diagnosis not present

## 2019-03-29 DIAGNOSIS — N390 Urinary tract infection, site not specified: Secondary | ICD-10-CM | POA: Diagnosis not present

## 2019-03-29 DIAGNOSIS — Z79899 Other long term (current) drug therapy: Secondary | ICD-10-CM | POA: Diagnosis not present

## 2019-04-07 DIAGNOSIS — F0390 Unspecified dementia without behavioral disturbance: Secondary | ICD-10-CM | POA: Diagnosis not present

## 2019-04-07 DIAGNOSIS — N390 Urinary tract infection, site not specified: Secondary | ICD-10-CM | POA: Diagnosis not present

## 2019-04-07 DIAGNOSIS — Z79899 Other long term (current) drug therapy: Secondary | ICD-10-CM | POA: Diagnosis not present

## 2019-04-08 DIAGNOSIS — N390 Urinary tract infection, site not specified: Secondary | ICD-10-CM | POA: Diagnosis not present

## 2019-04-08 DIAGNOSIS — Z79899 Other long term (current) drug therapy: Secondary | ICD-10-CM | POA: Diagnosis not present

## 2019-04-08 DIAGNOSIS — Z111 Encounter for screening for respiratory tuberculosis: Secondary | ICD-10-CM | POA: Diagnosis not present

## 2019-04-08 DIAGNOSIS — F0390 Unspecified dementia without behavioral disturbance: Secondary | ICD-10-CM | POA: Diagnosis not present

## 2019-04-08 DIAGNOSIS — N3000 Acute cystitis without hematuria: Secondary | ICD-10-CM | POA: Diagnosis not present

## 2019-04-08 DIAGNOSIS — K210 Gastro-esophageal reflux disease with esophagitis: Secondary | ICD-10-CM | POA: Diagnosis not present

## 2019-04-09 DIAGNOSIS — Z20828 Contact with and (suspected) exposure to other viral communicable diseases: Secondary | ICD-10-CM | POA: Diagnosis not present

## 2019-05-11 DIAGNOSIS — F0390 Unspecified dementia without behavioral disturbance: Secondary | ICD-10-CM | POA: Diagnosis not present

## 2019-05-11 DIAGNOSIS — M8580 Other specified disorders of bone density and structure, unspecified site: Secondary | ICD-10-CM | POA: Diagnosis not present

## 2019-05-11 DIAGNOSIS — E559 Vitamin D deficiency, unspecified: Secondary | ICD-10-CM | POA: Diagnosis not present

## 2019-05-11 DIAGNOSIS — K219 Gastro-esophageal reflux disease without esophagitis: Secondary | ICD-10-CM | POA: Diagnosis not present

## 2019-05-19 ENCOUNTER — Ambulatory Visit (INDEPENDENT_AMBULATORY_CARE_PROVIDER_SITE_OTHER): Payer: PPO | Admitting: Gastroenterology

## 2019-05-19 ENCOUNTER — Other Ambulatory Visit: Payer: Self-pay

## 2019-05-19 ENCOUNTER — Encounter: Payer: Self-pay | Admitting: Gastroenterology

## 2019-05-19 ENCOUNTER — Encounter (INDEPENDENT_AMBULATORY_CARE_PROVIDER_SITE_OTHER): Payer: Self-pay

## 2019-05-19 VITALS — BP 151/78 | HR 72 | Temp 97.7°F | Wt 143.5 lb

## 2019-05-19 DIAGNOSIS — K208 Other esophagitis without bleeding: Secondary | ICD-10-CM

## 2019-05-19 NOTE — Progress Notes (Signed)
Megan Antigua, MD [address]  [place]  [place], [place] [address]  Main: [phone]  Fax: [phone]   Primary Care Physician: Dion Body, MD   Chief Complaint  Patient presents with  . Gastroesophageal Reflux    having no symptoms     HPI: Megan Long is a 85 y.o. female here for follow-up of grade D esophagitis.  Patient presents with her daughter.  The patient denies abdominal or flank pain, anorexia, nausea or vomiting, dysphagia, change in bowel habits or black or bloody stools or weight loss.  Is currently on Protonix once daily prescribed by Dr. Tamala Julian at assisted living facility.  Doing well with this.  Reports good appetite.  Current Outpatient Medications  Medication Sig Dispense Refill  . cholecalciferol (VITAMIN D3) 25 MCG (1000 UT) tablet Take 1,000 Units by mouth 3 (three) times a week.    . pantoprazole (PROTONIX) 40 MG tablet Take 1 tablet (40 mg total) by mouth 2 (two) times daily for 30 days. 60 tablet 1   No current facility-administered medications for this visit.     Allergies as of 05/19/2019  . (No Known Allergies)    ROS:  General: Negative for anorexia, weight loss, fever, chills, fatigue, weakness. ENT: Negative for hoarseness, difficulty swallowing , nasal congestion. CV: Negative for chest pain, angina, palpitations, dyspnea on exertion, peripheral edema.  Respiratory: Negative for dyspnea at rest, dyspnea on exertion, cough, sputum, wheezing.  GI: See history of present illness. GU:  Negative for dysuria, hematuria, urinary incontinence, urinary frequency, nocturnal urination.  Endo: Negative for unusual weight change.    Physical Examination:   BP (!) 151/78 (BP Location: Left Arm, Patient Position: Sitting, Cuff Size: Normal)   Pulse 72   Temp 97.7 F (36.5 C) (Oral)   Wt 143 lb 8 oz (65.1 kg)   BMI 28.98 kg/m   General: Well-nourished, well-developed in no acute distress.  Eyes: No icterus.  Conjunctivae pink. Mouth: Oropharyngeal mucosa moist and pink , no lesions erythema or exudate. Neck: Supple, Trachea midline Abdomen: Bowel sounds are normal, nontender, nondistended, no hepatosplenomegaly or masses, no abdominal bruits or hernia , no rebound or guarding.   Extremities: No lower extremity edema. No clubbing or deformities. Neuro: Alert and oriented x 3.  Grossly intact. Skin: Warm and dry, no jaundice.   Psych: Alert and cooperative, normal mood and affect.   Labs: CMP     Component Value Date/Time   NA 138 01/22/2019 0152   K 3.2 (L) 01/22/2019 0152   CL 105 01/22/2019 0152   CO2 26 01/22/2019 0152   GLUCOSE 103 (H) 01/22/2019 0152   BUN 40 (H) 01/22/2019 0152   CREATININE 0.90 01/22/2019 0152   CALCIUM 7.9 (L) 01/22/2019 0152   PROT 6.9 01/21/2019 1450   ALBUMIN 3.8 01/21/2019 1450   AST 24 01/21/2019 1450   ALT 12 01/21/2019 1450   ALKPHOS 89 01/21/2019 1450   BILITOT 1.6 (H) 01/21/2019 1450   GFRNONAA 54 (L) 01/22/2019 0152   GFRAA >60 01/22/2019 0152   Lab Results  Component Value Date   WBC 7.1 01/22/2019   HGB 12.5 01/22/2019   HGB 12.4 01/22/2019   HCT 38.7 01/22/2019   HCT 38.3 01/22/2019   MCV 87.8 01/22/2019   PLT 138 (L) 01/22/2019    Imaging Studies: No results found.  Assessment and Plan:   Megan Long is a 85 y.o. y/o female here for follow-up of grade D esophagitis  Patient  previously completed twice daily PPI Now on once daily  Patient and daughter not interested in repeat upper endoscopy to ensure grade D esophagitis is healed and that Barrett's, which is a precancerous lesion is not present.  Risk and benefits of procedures discussed.  Given her age they do not want to undergo further procedures which is reasonable.  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  They would like to  avoid the risks of readmission, repeat symptoms, reviewed bleeding and repeat esophagitis, therefore they would like to continue on PPI for a year as prescribed by Dr. Katrinka BlazingSmith.  They have not noted any side effects, including any changes in mental status since starting the medication.  If any symptoms occur they will contact me.  (Risks of PPI use were discussed with patient including bone loss, C. Diff diarrhea, pneumonia, infections, CKD, electrolyte abnormalities.  Pt. Verbalizes understanding and chooses to continue the medication.)    Dr Melodie BouillonVarnita Lakesa Coste

## 2019-05-25 DIAGNOSIS — K219 Gastro-esophageal reflux disease without esophagitis: Secondary | ICD-10-CM | POA: Diagnosis not present

## 2019-05-25 DIAGNOSIS — E559 Vitamin D deficiency, unspecified: Secondary | ICD-10-CM | POA: Diagnosis not present

## 2019-05-25 DIAGNOSIS — F0390 Unspecified dementia without behavioral disturbance: Secondary | ICD-10-CM | POA: Diagnosis not present

## 2019-05-25 DIAGNOSIS — M8580 Other specified disorders of bone density and structure, unspecified site: Secondary | ICD-10-CM | POA: Diagnosis not present

## 2019-05-26 DIAGNOSIS — R05 Cough: Secondary | ICD-10-CM | POA: Diagnosis not present

## 2019-05-26 DIAGNOSIS — M2041 Other hammer toe(s) (acquired), right foot: Secondary | ICD-10-CM | POA: Diagnosis not present

## 2019-05-26 DIAGNOSIS — B351 Tinea unguium: Secondary | ICD-10-CM | POA: Diagnosis not present

## 2019-05-27 DIAGNOSIS — U071 COVID-19: Secondary | ICD-10-CM | POA: Diagnosis not present

## 2019-06-08 DIAGNOSIS — K219 Gastro-esophageal reflux disease without esophagitis: Secondary | ICD-10-CM | POA: Diagnosis not present

## 2019-06-08 DIAGNOSIS — F0390 Unspecified dementia without behavioral disturbance: Secondary | ICD-10-CM | POA: Diagnosis not present

## 2019-06-08 DIAGNOSIS — R2681 Unsteadiness on feet: Secondary | ICD-10-CM | POA: Diagnosis not present

## 2019-06-08 DIAGNOSIS — E559 Vitamin D deficiency, unspecified: Secondary | ICD-10-CM | POA: Diagnosis not present

## 2019-07-06 DIAGNOSIS — R2681 Unsteadiness on feet: Secondary | ICD-10-CM | POA: Diagnosis not present

## 2019-07-06 DIAGNOSIS — K219 Gastro-esophageal reflux disease without esophagitis: Secondary | ICD-10-CM | POA: Diagnosis not present

## 2019-07-06 DIAGNOSIS — E559 Vitamin D deficiency, unspecified: Secondary | ICD-10-CM | POA: Diagnosis not present

## 2019-07-06 DIAGNOSIS — M8580 Other specified disorders of bone density and structure, unspecified site: Secondary | ICD-10-CM | POA: Diagnosis not present

## 2019-08-03 DIAGNOSIS — E559 Vitamin D deficiency, unspecified: Secondary | ICD-10-CM | POA: Diagnosis not present

## 2019-08-03 DIAGNOSIS — K219 Gastro-esophageal reflux disease without esophagitis: Secondary | ICD-10-CM | POA: Diagnosis not present

## 2019-08-03 DIAGNOSIS — F0390 Unspecified dementia without behavioral disturbance: Secondary | ICD-10-CM | POA: Diagnosis not present

## 2019-08-03 DIAGNOSIS — B351 Tinea unguium: Secondary | ICD-10-CM | POA: Diagnosis not present

## 2019-08-12 DIAGNOSIS — Z20828 Contact with and (suspected) exposure to other viral communicable diseases: Secondary | ICD-10-CM | POA: Diagnosis not present

## 2019-08-12 DIAGNOSIS — U071 COVID-19: Secondary | ICD-10-CM | POA: Diagnosis not present

## 2019-08-13 DIAGNOSIS — U071 COVID-19: Secondary | ICD-10-CM | POA: Diagnosis not present

## 2019-08-31 DIAGNOSIS — F0390 Unspecified dementia without behavioral disturbance: Secondary | ICD-10-CM | POA: Diagnosis not present

## 2019-08-31 DIAGNOSIS — K219 Gastro-esophageal reflux disease without esophagitis: Secondary | ICD-10-CM | POA: Diagnosis not present

## 2019-08-31 DIAGNOSIS — M8580 Other specified disorders of bone density and structure, unspecified site: Secondary | ICD-10-CM | POA: Diagnosis not present

## 2019-08-31 DIAGNOSIS — E559 Vitamin D deficiency, unspecified: Secondary | ICD-10-CM | POA: Diagnosis not present

## 2019-09-06 DIAGNOSIS — E559 Vitamin D deficiency, unspecified: Secondary | ICD-10-CM | POA: Diagnosis not present

## 2019-09-06 DIAGNOSIS — Z87440 Personal history of urinary (tract) infections: Secondary | ICD-10-CM | POA: Diagnosis not present

## 2019-09-06 DIAGNOSIS — U071 COVID-19: Secondary | ICD-10-CM | POA: Diagnosis not present

## 2019-09-06 DIAGNOSIS — M6281 Muscle weakness (generalized): Secondary | ICD-10-CM | POA: Diagnosis not present

## 2019-09-06 DIAGNOSIS — R2681 Unsteadiness on feet: Secondary | ICD-10-CM | POA: Diagnosis not present

## 2019-09-06 DIAGNOSIS — K2100 Gastro-esophageal reflux disease with esophagitis, without bleeding: Secondary | ICD-10-CM | POA: Diagnosis not present

## 2019-09-06 DIAGNOSIS — Z9181 History of falling: Secondary | ICD-10-CM | POA: Diagnosis not present

## 2019-09-06 DIAGNOSIS — F0390 Unspecified dementia without behavioral disturbance: Secondary | ICD-10-CM | POA: Diagnosis not present

## 2019-09-15 DIAGNOSIS — U071 COVID-19: Secondary | ICD-10-CM | POA: Diagnosis not present

## 2019-09-15 DIAGNOSIS — E119 Type 2 diabetes mellitus without complications: Secondary | ICD-10-CM | POA: Diagnosis not present

## 2019-09-15 DIAGNOSIS — Z79899 Other long term (current) drug therapy: Secondary | ICD-10-CM | POA: Diagnosis not present

## 2019-09-15 DIAGNOSIS — K2100 Gastro-esophageal reflux disease with esophagitis, without bleeding: Secondary | ICD-10-CM | POA: Diagnosis not present

## 2019-09-15 DIAGNOSIS — E038 Other specified hypothyroidism: Secondary | ICD-10-CM | POA: Diagnosis not present

## 2019-09-15 DIAGNOSIS — D518 Other vitamin B12 deficiency anemias: Secondary | ICD-10-CM | POA: Diagnosis not present

## 2019-09-15 DIAGNOSIS — E7849 Other hyperlipidemia: Secondary | ICD-10-CM | POA: Diagnosis not present

## 2019-09-15 DIAGNOSIS — F0390 Unspecified dementia without behavioral disturbance: Secondary | ICD-10-CM | POA: Diagnosis not present

## 2019-09-15 DIAGNOSIS — E559 Vitamin D deficiency, unspecified: Secondary | ICD-10-CM | POA: Diagnosis not present

## 2019-09-28 DIAGNOSIS — I10 Essential (primary) hypertension: Secondary | ICD-10-CM | POA: Diagnosis not present

## 2019-09-28 DIAGNOSIS — F0390 Unspecified dementia without behavioral disturbance: Secondary | ICD-10-CM | POA: Diagnosis not present

## 2019-09-28 DIAGNOSIS — K219 Gastro-esophageal reflux disease without esophagitis: Secondary | ICD-10-CM | POA: Diagnosis not present

## 2019-09-28 DIAGNOSIS — R2681 Unsteadiness on feet: Secondary | ICD-10-CM | POA: Diagnosis not present

## 2019-10-05 DIAGNOSIS — M6281 Muscle weakness (generalized): Secondary | ICD-10-CM | POA: Diagnosis not present

## 2019-10-05 DIAGNOSIS — F0390 Unspecified dementia without behavioral disturbance: Secondary | ICD-10-CM | POA: Diagnosis not present

## 2019-10-05 DIAGNOSIS — E559 Vitamin D deficiency, unspecified: Secondary | ICD-10-CM | POA: Diagnosis not present

## 2019-10-05 DIAGNOSIS — U071 COVID-19: Secondary | ICD-10-CM | POA: Diagnosis not present

## 2019-10-05 DIAGNOSIS — Z9181 History of falling: Secondary | ICD-10-CM | POA: Diagnosis not present

## 2019-10-05 DIAGNOSIS — Z87440 Personal history of urinary (tract) infections: Secondary | ICD-10-CM | POA: Diagnosis not present

## 2019-10-05 DIAGNOSIS — K2100 Gastro-esophageal reflux disease with esophagitis, without bleeding: Secondary | ICD-10-CM | POA: Diagnosis not present

## 2019-10-05 DIAGNOSIS — R2681 Unsteadiness on feet: Secondary | ICD-10-CM | POA: Diagnosis not present

## 2019-10-07 ENCOUNTER — Encounter: Payer: Self-pay | Admitting: Emergency Medicine

## 2019-10-07 ENCOUNTER — Other Ambulatory Visit: Payer: Self-pay

## 2019-10-07 ENCOUNTER — Emergency Department
Admission: EM | Admit: 2019-10-07 | Discharge: 2019-10-07 | Disposition: A | Discharge status: Home / Self Care | Payer: PPO | Attending: Emergency Medicine | Admitting: Emergency Medicine

## 2019-10-07 DIAGNOSIS — R112 Nausea with vomiting, unspecified: Secondary | ICD-10-CM | POA: Diagnosis not present

## 2019-10-07 DIAGNOSIS — F0390 Unspecified dementia without behavioral disturbance: Secondary | ICD-10-CM | POA: Insufficient documentation

## 2019-10-07 DIAGNOSIS — I491 Atrial premature depolarization: Secondary | ICD-10-CM | POA: Diagnosis not present

## 2019-10-07 DIAGNOSIS — Z8659 Personal history of other mental and behavioral disorders: Secondary | ICD-10-CM

## 2019-10-07 DIAGNOSIS — R0902 Hypoxemia: Secondary | ICD-10-CM | POA: Diagnosis not present

## 2019-10-07 DIAGNOSIS — I10 Essential (primary) hypertension: Secondary | ICD-10-CM | POA: Diagnosis not present

## 2019-10-07 DIAGNOSIS — Z79899 Other long term (current) drug therapy: Secondary | ICD-10-CM | POA: Insufficient documentation

## 2019-10-07 DIAGNOSIS — R110 Nausea: Secondary | ICD-10-CM | POA: Diagnosis not present

## 2019-10-07 DIAGNOSIS — I447 Left bundle-branch block, unspecified: Secondary | ICD-10-CM | POA: Diagnosis not present

## 2019-10-07 HISTORY — DX: Age-related osteoporosis without current pathological fracture: M81.0

## 2019-10-07 HISTORY — DX: Gastro-esophageal reflux disease without esophagitis: K21.9

## 2019-10-07 HISTORY — DX: Vitamin D deficiency, unspecified: E55.9

## 2019-10-07 LAB — COMPREHENSIVE METABOLIC PANEL
ALT: 10 U/L (ref 0–44)
AST: 18 U/L (ref 15–41)
Albumin: 3.9 g/dL (ref 3.5–5.0)
Alkaline Phosphatase: 93 U/L (ref 38–126)
Anion gap: 8 (ref 5–15)
BUN: 26 mg/dL — ABNORMAL HIGH (ref 8–23)
CO2: 26 mmol/L (ref 22–32)
Calcium: 9.1 mg/dL (ref 8.9–10.3)
Chloride: 107 mmol/L (ref 98–111)
Creatinine, Ser: 0.92 mg/dL (ref 0.44–1.00)
GFR calc Af Amer: >60 mL/min (ref >60)
GFR calc non Af Amer: 53 mL/min — ABNORMAL LOW (ref >60)
Glucose, Bld: 126 mg/dL — ABNORMAL HIGH (ref 70–99)
Potassium: 3.9 mmol/L (ref 3.5–5.1)
Sodium: 141 mmol/L (ref 135–145)
Total Bilirubin: 1.4 mg/dL — ABNORMAL HIGH (ref 0.3–1.2)
Total Protein: 6.9 g/dL (ref 6.5–8.1)

## 2019-10-07 LAB — CBC
HCT: 45.8 % (ref 36.0–46.0)
Hemoglobin: 14.7 g/dL (ref 12.0–15.0)
MCH: 28.3 pg (ref 26.0–34.0)
MCHC: 32.1 g/dL (ref 30.0–36.0)
MCV: 88.1 fL (ref 80.0–100.0)
Platelets: 143 10*3/uL — ABNORMAL LOW (ref 150–400)
RBC: 5.2 MIL/uL — ABNORMAL HIGH (ref 3.87–5.11)
RDW: 15.9 % — ABNORMAL HIGH (ref 11.5–15.5)
WBC: 6.6 10*3/uL (ref 4.0–10.5)
nRBC: 0 % (ref 0.0–0.2)

## 2019-10-07 LAB — LIPASE, BLOOD: Lipase: 28 U/L (ref 11–51)

## 2019-10-07 MED ORDER — ONDANSETRON 4 MG PO TBDP
ORAL_TABLET | ORAL | Status: AC
  Filled 2019-10-07: qty 1

## 2019-10-07 MED ORDER — ONDANSETRON 4 MG PO TBDP
4.0000 mg | ORAL_TABLET | Freq: Once | ORAL | Status: AC | PRN
  Administered 2019-10-07: 18:00:00 4 mg via ORAL

## 2019-10-07 MED ORDER — SODIUM CHLORIDE 0.9% FLUSH: 3.0000 mL | Freq: Once | INTRAVENOUS | Status: DC

## 2019-10-07 NOTE — ED Triage Notes (Signed)
Ems from [facility] [employer] for htn.  Had n/v earier today, but okay now.  A and o x 3.  174/98.

## 2019-10-07 NOTE — ED Provider Notes (Signed)
[facility] Emergency Department Provider Note   ____________________________________________   First MD Initiated Contact with Patient 10/07/19 1751     (approximate)  I have reviewed the triage vital signs and the nursing notes.   HISTORY  Chief Complaint Nausea, Emesis, and Hypertension    HPI Megan Long is a 86 y.o. female with past medical history of dementia and GERD who presents to the ED for nausea and vomiting.  History is limited due to patient's dementia.  Per EMS, she had an episode of nausea and vomited once at the nursing home where she resides.  Patient does not remember this and states she feels fine, denies any abdominal pain.  She has not had any fevers, cough, chest pain, shortness of breath, dysuria, or hematuria.        Past Medical History:  Diagnosis Date  . Dementia (HCC)   . GERD (gastroesophageal reflux disease)   . Osteoporosis   . Vitamin D deficiency     Patient Active Problem List   Diagnosis Date Noted  . Coffee ground emesis   . Reflux esophagitis   . GI bleed 01/21/2019  . DNR (do not resuscitate) 04/17/2017    Past Surgical History:  Procedure Laterality Date  . ESOPHAGOGASTRODUODENOSCOPY Left 01/22/2019   Procedure: ESOPHAGOGASTRODUODENOSCOPY (EGD);  Surgeon: Pasty Spillers, MD;  Location: [facility] ENDOSCOPY;  Service: Endoscopy;  Laterality: Left;    Prior to Admission medications   Medication Sig Start Date End Date Taking? Authorizing Provider  cholecalciferol (VITAMIN D3) 25 MCG (1000 UT) tablet Take 1,000 Units by mouth 3 (three) times a week.    [provider]  pantoprazole (PROTONIX) 40 MG tablet Take 1 tablet (40 mg total) by mouth 2 (two) times daily for 30 days. 01/27/19 05/19/19  Pasty Spillers, MD    Allergies Patient has no known allergies.  History reviewed. No pertinent family history.  Social History Social History   Tobacco Use  . Smoking status: Never Smoker    . Smokeless tobacco: Never Used  Substance Use Topics  . Alcohol use: Not on file  . Drug use: Not on file    Review of Systems  Constitutional: No fever/chills Eyes: No visual changes. ENT: No sore throat. Cardiovascular: Denies chest pain. Respiratory: Denies shortness of breath. Gastrointestinal: No abdominal pain.  Positive for nausea and vomiting.  No diarrhea.  No constipation. Genitourinary: Negative for dysuria. Musculoskeletal: Negative for back pain. Skin: Negative for rash. Neurological: Negative for headaches, focal weakness or numbness.  ____________________________________________   PHYSICAL EXAM:  VITAL SIGNS: ED Triage Vitals  Enc Vitals Group     BP 10/07/19 1524 (!) 155/83     Pulse Rate 10/07/19 1524 65     Resp 10/07/19 1524 (!) 97     Temp 10/07/19 1524 97.9 F (36.6 C)     Temp Source 10/07/19 1524 Oral     SpO2 10/07/19 1524 97 %     Weight 10/07/19 1525 143 lb (64.9 kg)     Height 10/07/19 1525 4\' 11"  (1.499 m)     Head Circumference --      Peak Flow --      Pain Score --      Pain Loc --      Pain Edu? --      Excl. in GC? --     Constitutional: Alert and oriented to person and place, but not time. Eyes: Conjunctivae are normal. Head: Atraumatic. Nose: No congestion/rhinnorhea.  Mouth/Throat: Mucous membranes are moist. Neck: Normal ROM Cardiovascular: Normal rate, regular rhythm. Grossly normal heart sounds. Respiratory: Normal respiratory effort.  No retractions. Lungs CTAB. Gastrointestinal: Soft and nontender. No distention. Genitourinary: deferred Musculoskeletal: No lower extremity tenderness nor edema. Neurologic:  Normal speech and language. No gross focal neurologic deficits are appreciated. Skin:  Skin is warm, dry and intact. No rash noted. Psychiatric: Mood and affect are normal. Speech and behavior are normal.  ____________________________________________   LABS (all labs ordered are listed, but only abnormal  results are displayed)  Labs Reviewed  COMPREHENSIVE METABOLIC PANEL - Abnormal; Notable for the following components:      Result Value   Glucose, Bld 126 (*)    BUN 26 (*)    Total Bilirubin 1.4 (*)    GFR calc non Af Amer 53 (*)    All other components within normal limits  CBC - Abnormal; Notable for the following components:   RBC 5.20 (*)    RDW 15.9 (*)    Platelets 143 (*)    All other components within normal limits  LIPASE, BLOOD  URINALYSIS, COMPLETE (UACMP) WITH MICROSCOPIC   ____________________________________________  EKG  ED ECG REPORT I, Blake Divine, the attending physician, personally viewed and interpreted this ECG.   Date: 10/07/2019  EKG Time: 15:27  Rate: 79  Rhythm: normal sinus rhythm, PAC's noted  Axis: LAD  Intervals:left bundle branch block  ST&T Change: None. Negative sgarbossa   PROCEDURES  Procedure(s) performed (including Critical Care):  Procedures   ____________________________________________   INITIAL IMPRESSION / ASSESSMENT AND PLAN / ED COURSE       86 year old female with history of dementia presents to the ED following episode of nausea and vomiting at the nursing facility where she resides.  She seems to be at her baseline mental status with no focal neurologic deficits.  She denies any complaints at this time and has a benign abdominal exam.  Lab work is reassuring, I doubt UTI given lack of urinary symptoms or any changes in her mental status.  She is appropriate for discharge back to nursing facility.      ____________________________________________   FINAL CLINICAL IMPRESSION(S) / ED DIAGNOSES  Final diagnoses:  Non-intractable vomiting with nausea, unspecified vomiting type  History of dementia     ED Discharge Orders    None       Note:  This document was prepared using Dragon voice recognition software and may include unintentional dictation errors.   Blake Divine, MD 10/07/19 [phone]

## 2019-10-07 NOTE — ED Notes (Signed)
Pt legal guardian, Ernst Spell, updated on d/c. No further questions or concerns at this time. Daughter states she will come pick up her mother and bring her back to [facility].

## 2019-10-07 NOTE — ED Triage Notes (Signed)
Per First RN Note pt sent from [facility] for eval for HTN, pt had nausea and vomiting earlier today. Per pt she was sent because she came to check on someone else but they kept her instead. Pt c/o back pain at this time. Pt is alert, oriented to person and place, disoriented to time and situation.

## 2019-10-07 NOTE — ED Notes (Signed)
Report given to [facility] with Diamantina Monks

## 2019-10-07 NOTE — ED Notes (Signed)
Attempted to contact [place], [place]. No answer. Voicemail left.

## 2019-10-26 DIAGNOSIS — R2681 Unsteadiness on feet: Secondary | ICD-10-CM | POA: Diagnosis not present

## 2019-10-26 DIAGNOSIS — F0390 Unspecified dementia without behavioral disturbance: Secondary | ICD-10-CM | POA: Diagnosis not present

## 2019-10-26 DIAGNOSIS — K219 Gastro-esophageal reflux disease without esophagitis: Secondary | ICD-10-CM | POA: Diagnosis not present

## 2019-10-26 DIAGNOSIS — I10 Essential (primary) hypertension: Secondary | ICD-10-CM | POA: Diagnosis not present

## 2019-11-04 DIAGNOSIS — F0390 Unspecified dementia without behavioral disturbance: Secondary | ICD-10-CM | POA: Diagnosis not present

## 2019-11-04 DIAGNOSIS — E559 Vitamin D deficiency, unspecified: Secondary | ICD-10-CM | POA: Diagnosis not present

## 2019-11-04 DIAGNOSIS — Z9181 History of falling: Secondary | ICD-10-CM | POA: Diagnosis not present

## 2019-11-04 DIAGNOSIS — Z87440 Personal history of urinary (tract) infections: Secondary | ICD-10-CM | POA: Diagnosis not present

## 2019-11-04 DIAGNOSIS — R2681 Unsteadiness on feet: Secondary | ICD-10-CM | POA: Diagnosis not present

## 2019-11-04 DIAGNOSIS — U071 COVID-19: Secondary | ICD-10-CM | POA: Diagnosis not present

## 2019-11-04 DIAGNOSIS — M6281 Muscle weakness (generalized): Secondary | ICD-10-CM | POA: Diagnosis not present

## 2019-11-04 DIAGNOSIS — K2100 Gastro-esophageal reflux disease with esophagitis, without bleeding: Secondary | ICD-10-CM | POA: Diagnosis not present

## 2019-11-11 DIAGNOSIS — E119 Type 2 diabetes mellitus without complications: Secondary | ICD-10-CM | POA: Diagnosis not present

## 2019-11-11 DIAGNOSIS — D518 Other vitamin B12 deficiency anemias: Secondary | ICD-10-CM | POA: Diagnosis not present

## 2019-11-11 DIAGNOSIS — E7849 Other hyperlipidemia: Secondary | ICD-10-CM | POA: Diagnosis not present

## 2019-11-11 DIAGNOSIS — E559 Vitamin D deficiency, unspecified: Secondary | ICD-10-CM | POA: Diagnosis not present

## 2019-11-11 DIAGNOSIS — Z79899 Other long term (current) drug therapy: Secondary | ICD-10-CM | POA: Diagnosis not present

## 2019-11-23 DIAGNOSIS — K219 Gastro-esophageal reflux disease without esophagitis: Secondary | ICD-10-CM | POA: Diagnosis not present

## 2019-11-23 DIAGNOSIS — E559 Vitamin D deficiency, unspecified: Secondary | ICD-10-CM | POA: Diagnosis not present

## 2019-11-23 DIAGNOSIS — F0390 Unspecified dementia without behavioral disturbance: Secondary | ICD-10-CM | POA: Diagnosis not present

## 2019-11-23 DIAGNOSIS — I10 Essential (primary) hypertension: Secondary | ICD-10-CM | POA: Diagnosis not present

## 2019-12-15 DIAGNOSIS — R030 Elevated blood-pressure reading, without diagnosis of hypertension: Secondary | ICD-10-CM | POA: Diagnosis not present

## 2019-12-15 DIAGNOSIS — F0390 Unspecified dementia without behavioral disturbance: Secondary | ICD-10-CM | POA: Diagnosis not present

## 2019-12-15 DIAGNOSIS — Z0000 Encounter for general adult medical examination without abnormal findings: Secondary | ICD-10-CM | POA: Diagnosis not present

## 2019-12-20 DIAGNOSIS — B351 Tinea unguium: Secondary | ICD-10-CM | POA: Diagnosis not present

## 2019-12-20 DIAGNOSIS — L84 Corns and callosities: Secondary | ICD-10-CM | POA: Diagnosis not present

## 2019-12-21 DIAGNOSIS — E559 Vitamin D deficiency, unspecified: Secondary | ICD-10-CM | POA: Diagnosis not present

## 2019-12-21 DIAGNOSIS — R2681 Unsteadiness on feet: Secondary | ICD-10-CM | POA: Diagnosis not present

## 2019-12-21 DIAGNOSIS — M8580 Other specified disorders of bone density and structure, unspecified site: Secondary | ICD-10-CM | POA: Diagnosis not present

## 2019-12-21 DIAGNOSIS — K219 Gastro-esophageal reflux disease without esophagitis: Secondary | ICD-10-CM | POA: Diagnosis not present

## 2020-01-17 DIAGNOSIS — U071 COVID-19: Secondary | ICD-10-CM | POA: Diagnosis not present

## 2020-01-18 DIAGNOSIS — R2681 Unsteadiness on feet: Secondary | ICD-10-CM | POA: Diagnosis not present

## 2020-01-18 DIAGNOSIS — E559 Vitamin D deficiency, unspecified: Secondary | ICD-10-CM | POA: Diagnosis not present

## 2020-01-18 DIAGNOSIS — U071 COVID-19: Secondary | ICD-10-CM | POA: Diagnosis not present

## 2020-01-18 DIAGNOSIS — M8580 Other specified disorders of bone density and structure, unspecified site: Secondary | ICD-10-CM | POA: Diagnosis not present

## 2020-01-18 DIAGNOSIS — K219 Gastro-esophageal reflux disease without esophagitis: Secondary | ICD-10-CM | POA: Diagnosis not present

## 2020-01-25 DIAGNOSIS — E538 Deficiency of other specified B group vitamins: Secondary | ICD-10-CM | POA: Diagnosis not present

## 2020-01-25 DIAGNOSIS — H9193 Unspecified hearing loss, bilateral: Secondary | ICD-10-CM | POA: Diagnosis not present

## 2020-01-25 DIAGNOSIS — E519 Thiamine deficiency, unspecified: Secondary | ICD-10-CM | POA: Diagnosis not present

## 2020-01-25 DIAGNOSIS — F0280 Dementia in other diseases classified elsewhere without behavioral disturbance: Secondary | ICD-10-CM | POA: Diagnosis not present

## 2020-01-25 DIAGNOSIS — G301 Alzheimer's disease with late onset: Secondary | ICD-10-CM | POA: Diagnosis not present

## 2020-01-28 ENCOUNTER — Other Ambulatory Visit: Payer: Self-pay | Admitting: Neurology

## 2020-01-28 DIAGNOSIS — F0280 Dementia in other diseases classified elsewhere without behavioral disturbance: Secondary | ICD-10-CM

## 2020-02-03 DIAGNOSIS — D518 Other vitamin B12 deficiency anemias: Secondary | ICD-10-CM | POA: Diagnosis not present

## 2020-02-03 DIAGNOSIS — Z79899 Other long term (current) drug therapy: Secondary | ICD-10-CM | POA: Diagnosis not present

## 2020-02-03 DIAGNOSIS — E038 Other specified hypothyroidism: Secondary | ICD-10-CM | POA: Diagnosis not present

## 2020-02-03 DIAGNOSIS — E559 Vitamin D deficiency, unspecified: Secondary | ICD-10-CM | POA: Diagnosis not present

## 2020-02-03 DIAGNOSIS — E7849 Other hyperlipidemia: Secondary | ICD-10-CM | POA: Diagnosis not present

## 2020-02-03 DIAGNOSIS — E119 Type 2 diabetes mellitus without complications: Secondary | ICD-10-CM | POA: Diagnosis not present

## 2020-02-10 ENCOUNTER — Ambulatory Visit
Admission: RE | Admit: 2020-02-10 | Discharge: 2020-02-10 | Disposition: A | Discharge status: Home / Self Care | Payer: PPO | Source: Ambulatory Visit | Attending: Neurology | Admitting: Neurology

## 2020-02-10 ENCOUNTER — Other Ambulatory Visit: Payer: Self-pay

## 2020-02-10 DIAGNOSIS — G301 Alzheimer's disease with late onset: Secondary | ICD-10-CM | POA: Diagnosis not present

## 2020-02-10 DIAGNOSIS — E538 Deficiency of other specified B group vitamins: Secondary | ICD-10-CM | POA: Diagnosis not present

## 2020-02-10 DIAGNOSIS — F0280 Dementia in other diseases classified elsewhere without behavioral disturbance: Secondary | ICD-10-CM | POA: Insufficient documentation

## 2020-02-10 DIAGNOSIS — R519 Headache, unspecified: Secondary | ICD-10-CM | POA: Diagnosis not present

## 2020-02-15 DIAGNOSIS — F0390 Unspecified dementia without behavioral disturbance: Secondary | ICD-10-CM | POA: Diagnosis not present

## 2020-02-15 DIAGNOSIS — M8580 Other specified disorders of bone density and structure, unspecified site: Secondary | ICD-10-CM | POA: Diagnosis not present

## 2020-02-15 DIAGNOSIS — K219 Gastro-esophageal reflux disease without esophagitis: Secondary | ICD-10-CM | POA: Diagnosis not present

## 2020-02-15 DIAGNOSIS — E559 Vitamin D deficiency, unspecified: Secondary | ICD-10-CM | POA: Diagnosis not present

## 2020-02-17 DIAGNOSIS — E538 Deficiency of other specified B group vitamins: Secondary | ICD-10-CM | POA: Diagnosis not present

## 2020-02-24 DIAGNOSIS — E538 Deficiency of other specified B group vitamins: Secondary | ICD-10-CM | POA: Diagnosis not present

## 2020-03-02 DIAGNOSIS — E538 Deficiency of other specified B group vitamins: Secondary | ICD-10-CM | POA: Diagnosis not present

## 2020-03-14 DIAGNOSIS — M8580 Other specified disorders of bone density and structure, unspecified site: Secondary | ICD-10-CM | POA: Diagnosis not present

## 2020-03-14 DIAGNOSIS — E559 Vitamin D deficiency, unspecified: Secondary | ICD-10-CM | POA: Diagnosis not present

## 2020-03-14 DIAGNOSIS — F0390 Unspecified dementia without behavioral disturbance: Secondary | ICD-10-CM | POA: Diagnosis not present

## 2020-03-14 DIAGNOSIS — K219 Gastro-esophageal reflux disease without esophagitis: Secondary | ICD-10-CM | POA: Diagnosis not present

## 2020-03-20 DIAGNOSIS — K209 Esophagitis, unspecified without bleeding: Secondary | ICD-10-CM | POA: Diagnosis not present

## 2020-03-20 DIAGNOSIS — E538 Deficiency of other specified B group vitamins: Secondary | ICD-10-CM | POA: Diagnosis not present

## 2020-03-20 DIAGNOSIS — F0280 Dementia in other diseases classified elsewhere without behavioral disturbance: Secondary | ICD-10-CM | POA: Diagnosis not present

## 2020-03-20 DIAGNOSIS — R2689 Other abnormalities of gait and mobility: Secondary | ICD-10-CM | POA: Diagnosis not present

## 2020-03-20 DIAGNOSIS — Z7189 Other specified counseling: Secondary | ICD-10-CM | POA: Diagnosis not present

## 2020-03-20 DIAGNOSIS — M810 Age-related osteoporosis without current pathological fracture: Secondary | ICD-10-CM | POA: Diagnosis not present

## 2020-03-29 DIAGNOSIS — R2689 Other abnormalities of gait and mobility: Secondary | ICD-10-CM | POA: Diagnosis not present

## 2020-03-29 DIAGNOSIS — S60519A Abrasion of unspecified hand, initial encounter: Secondary | ICD-10-CM | POA: Diagnosis not present

## 2020-03-31 DIAGNOSIS — M810 Age-related osteoporosis without current pathological fracture: Secondary | ICD-10-CM | POA: Diagnosis not present

## 2020-03-31 DIAGNOSIS — Z741 Need for assistance with personal care: Secondary | ICD-10-CM | POA: Diagnosis not present

## 2020-03-31 DIAGNOSIS — F0280 Dementia in other diseases classified elsewhere without behavioral disturbance: Secondary | ICD-10-CM | POA: Diagnosis not present

## 2020-03-31 DIAGNOSIS — R269 Unspecified abnormalities of gait and mobility: Secondary | ICD-10-CM | POA: Diagnosis not present

## 2020-04-03 DIAGNOSIS — F0280 Dementia in other diseases classified elsewhere without behavioral disturbance: Secondary | ICD-10-CM | POA: Diagnosis not present

## 2020-04-03 DIAGNOSIS — Z741 Need for assistance with personal care: Secondary | ICD-10-CM | POA: Diagnosis not present

## 2020-04-03 DIAGNOSIS — M810 Age-related osteoporosis without current pathological fracture: Secondary | ICD-10-CM | POA: Diagnosis not present

## 2020-04-03 DIAGNOSIS — R269 Unspecified abnormalities of gait and mobility: Secondary | ICD-10-CM | POA: Diagnosis not present

## 2020-04-05 DIAGNOSIS — R198 Other specified symptoms and signs involving the digestive system and abdomen: Secondary | ICD-10-CM | POA: Diagnosis not present

## 2020-04-19 DIAGNOSIS — F0280 Dementia in other diseases classified elsewhere without behavioral disturbance: Secondary | ICD-10-CM | POA: Diagnosis not present

## 2020-04-19 DIAGNOSIS — B368 Other specified superficial mycoses: Secondary | ICD-10-CM | POA: Diagnosis not present

## 2020-05-02 DIAGNOSIS — F0280 Dementia in other diseases classified elsewhere without behavioral disturbance: Secondary | ICD-10-CM | POA: Diagnosis not present

## 2020-05-02 DIAGNOSIS — B368 Other specified superficial mycoses: Secondary | ICD-10-CM | POA: Diagnosis not present

## 2021-04-09 IMAGING — CT CT HEAD W/O CM
3 series · 15 of 46 positions shown, 18 images · non-contrast
Comparison: None.

CLINICAL DATA: [AGE] female with increased confusion.

EXAM:
CT HEAD WITHOUT CONTRAST
TECHNIQUE: Contiguous axial images were obtained from the base of the skull
through the vertex without intravenous contrast.

[Series 3: head wo · axial · 0.41mm/px · z∈[-122,-2]mm · 9 of 29 slices shown, 12 images]
[im 3/29  brain]
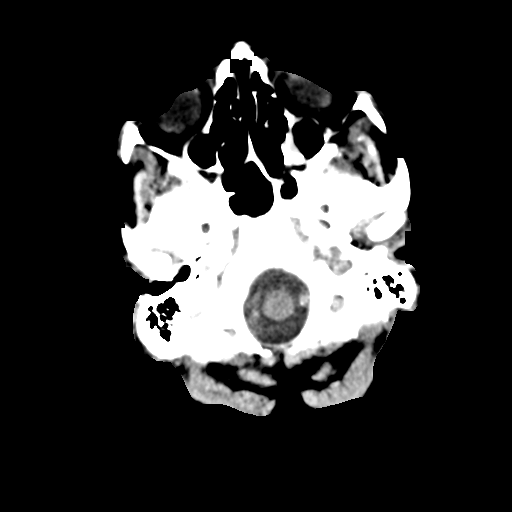
[im 3/29  bone]
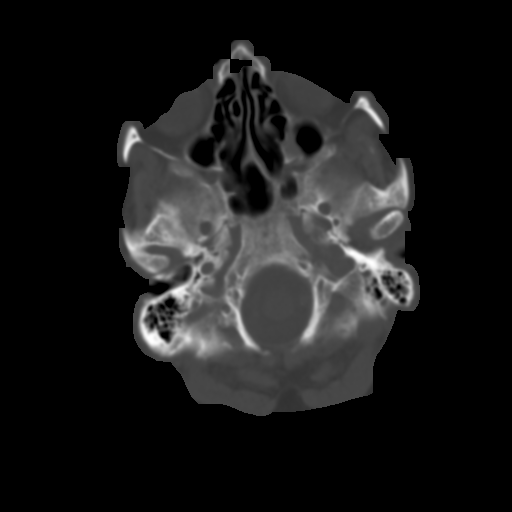
[im 6/29  brain]
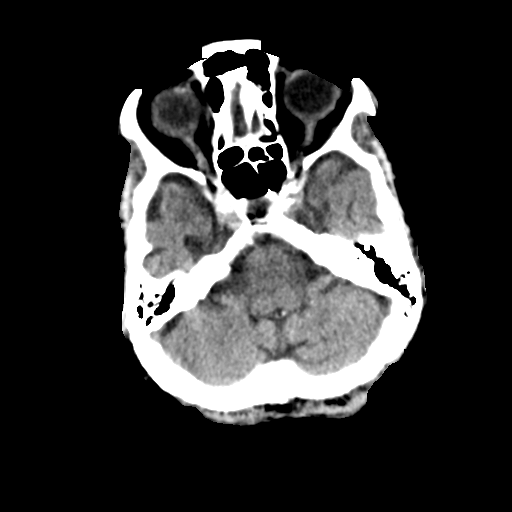
[im 9/29  brain]
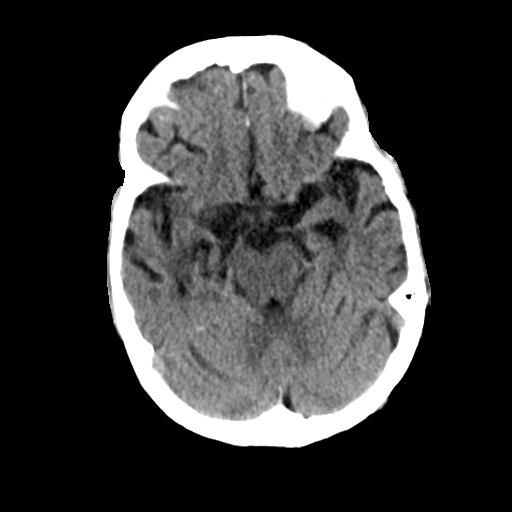
[im 12/29  brain]
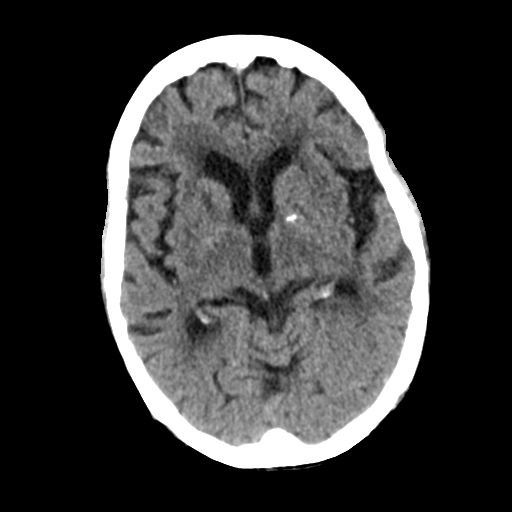
[im 15/29  brain]
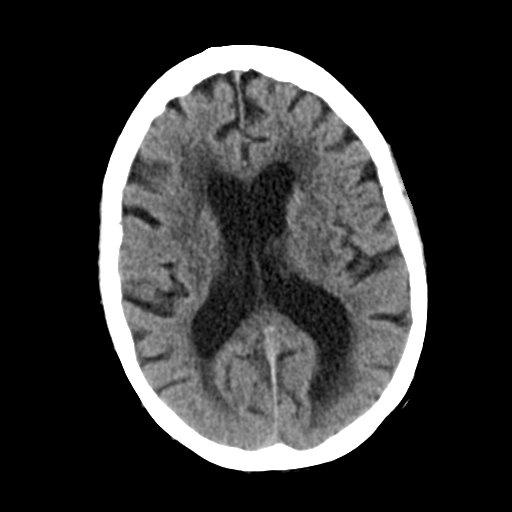
[im 15/29  bone]
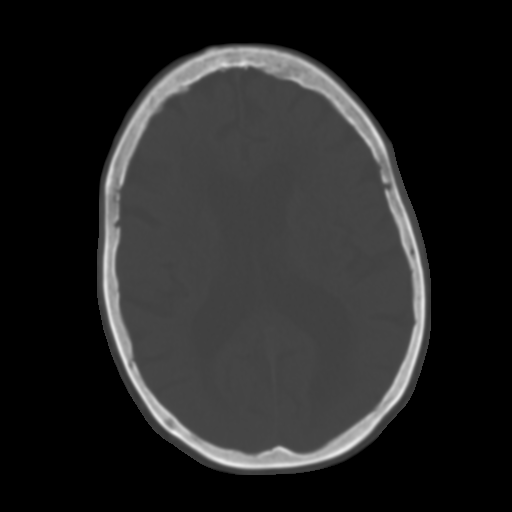
[im 18/29  brain]
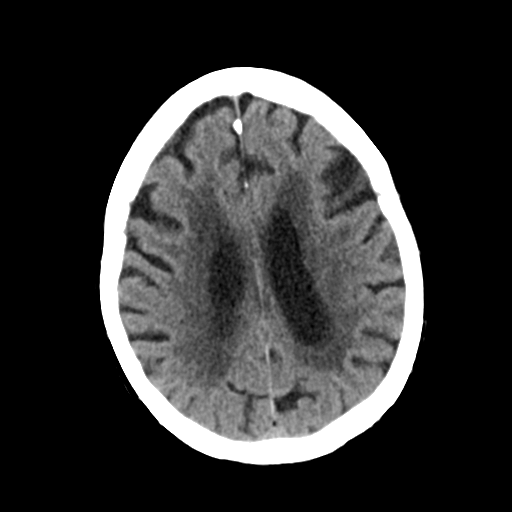
[im 21/29  brain]
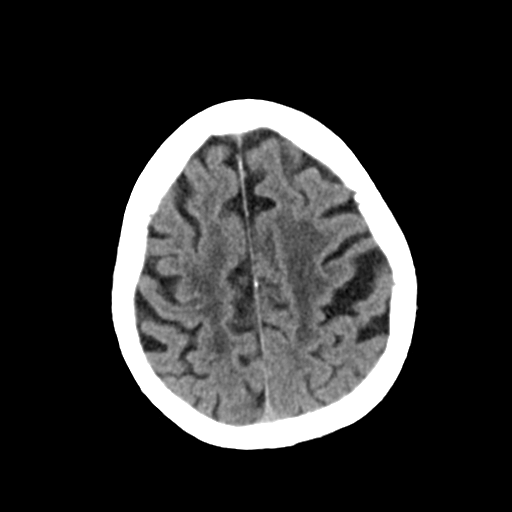
[im 24/29  brain]
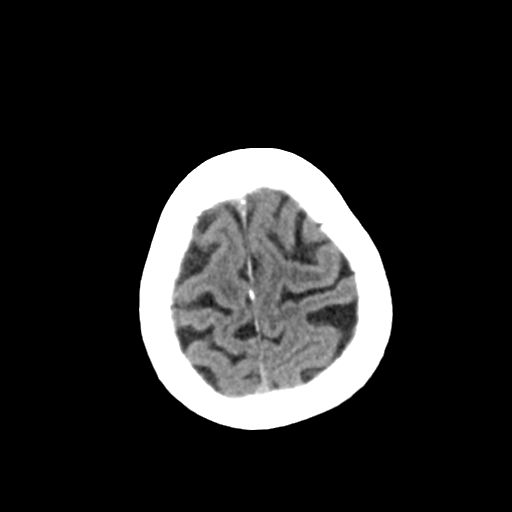
[im 27/29  brain]
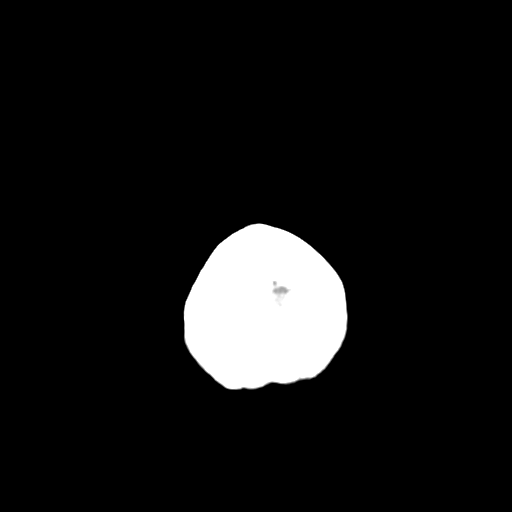
[im 27/29  bone]
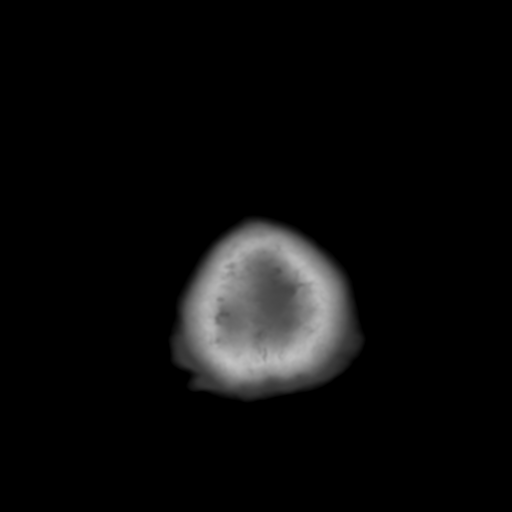

[Series 4: coronal soft tissue · coronal · 0.30mm/px · 3 of 67 slices shown]
[im 23/67  brain]
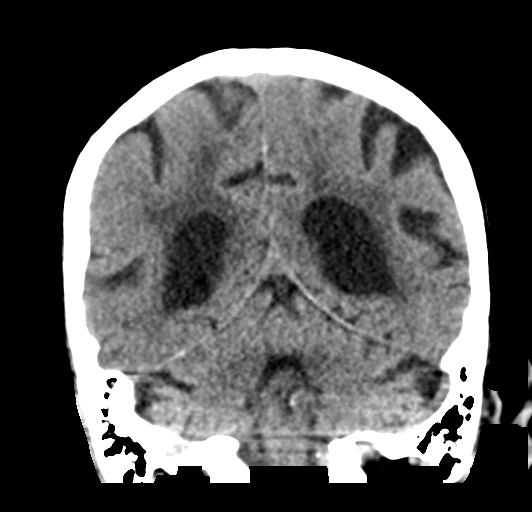
[im 30/67  brain]
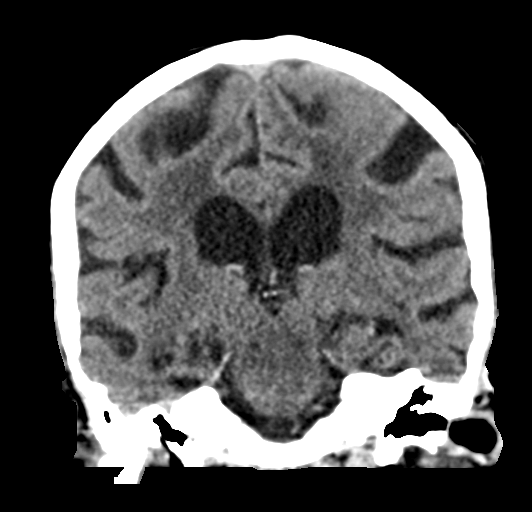
[im 37/67  brain]
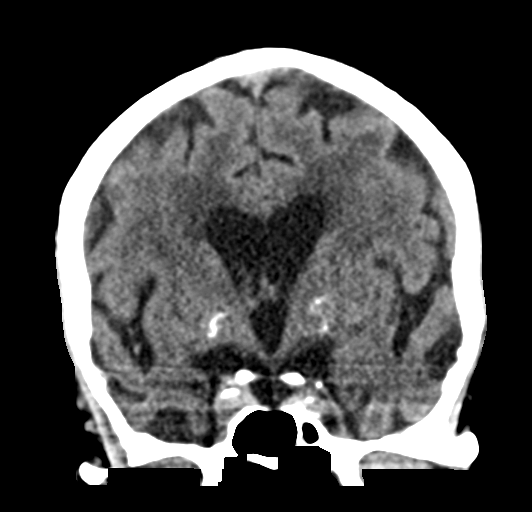

[Series 5: sagittal soft tissue · sagittal · 0.33mm/px · 3 of 54 slices shown]
[im 18/54  brain]
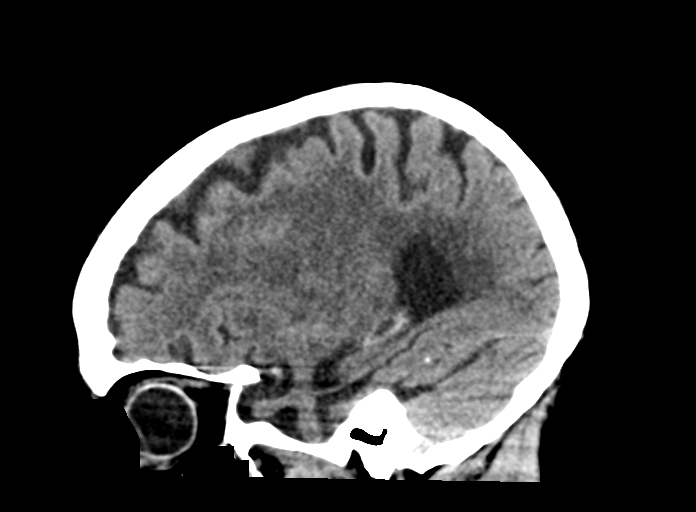
[im 27/54  brain]
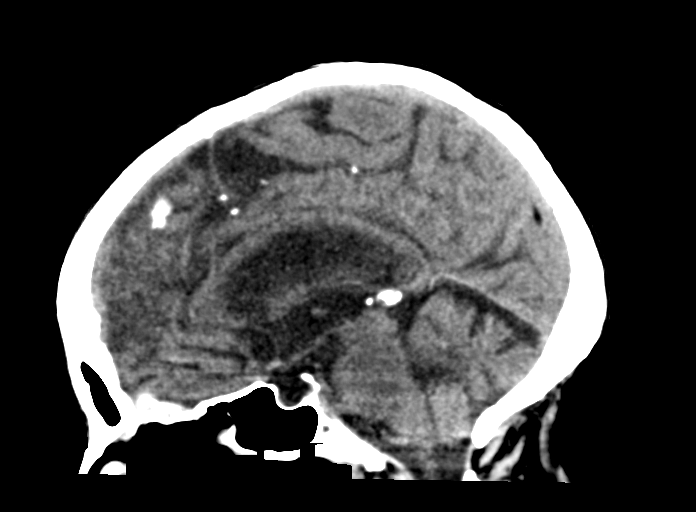
[im 36/54  brain]
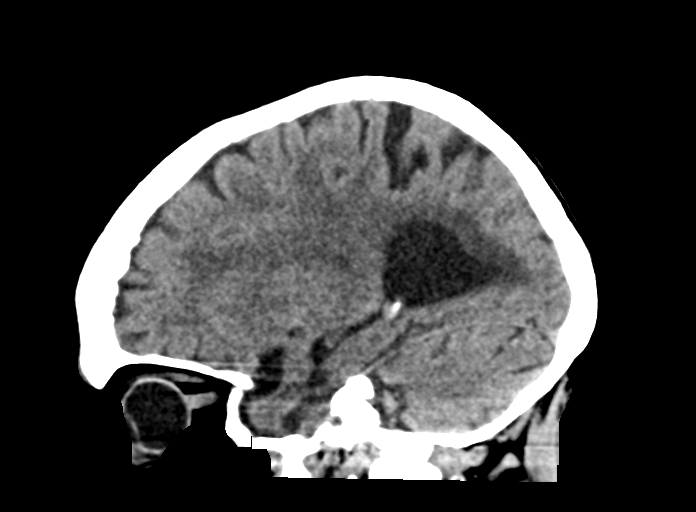

[15 of 46 positions shown; findings below may reference images not displayed]

FINDINGS: Brain: No midline shift, mass effect, or evidence of intracranial
mass lesion. No ventriculomegaly. No acute intracranial hemorrhage
identified.

Patchy and confluent bilateral cerebral white matter hypodensity,
with similar heterogeneity in the bilateral deep gray nuclei. No
cortically based acute infarct identified. No cortical
encephalomalacia identified, although there does appear to be
disproportionate atrophy of the bilateral mesial temporal lobes
(coronal image 34).

Vascular: Calcified atherosclerosis at the skull base. No suspicious
intracranial vascular hyperdensity.

Skull: No acute osseous abnormality identified.

Sinuses/Orbits: Visualized paranasal sinuses and mastoids are well
pneumatized.

Other: No acute orbit or scalp soft tissue findings.
IMPRESSION: 1. No acute intracranial abnormality identified.
2. Evidence of advanced chronic small vessel disease.
Disproportionate atrophy of the mesial temporal lobes.

## 2021-10-03 DEATH — deceased

## 2021-10-31 DEATH — deceased
